# Patient Record
Sex: Female | Born: 1980 | Race: Asian | Hispanic: No | Marital: Married | Smoking: Never smoker
Health system: Southern US, Community
[De-identification: ages and names within clinical notes are randomized; demographics above are authoritative.]

## PROBLEM LIST (undated history)

## (undated) ENCOUNTER — Inpatient Hospital Stay (HOSPITAL_COMMUNITY): Payer: Self-pay

## (undated) DIAGNOSIS — R7611 Nonspecific reaction to tuberculin skin test without active tuberculosis: Secondary | ICD-10-CM

## (undated) DIAGNOSIS — R51 Headache: Secondary | ICD-10-CM

## (undated) HISTORY — PX: NO PAST SURGERIES: SHX2092

---

## 2010-09-15 ENCOUNTER — Ambulatory Visit
Admission: RE | Admit: 2010-09-15 | Discharge: 2010-09-15 | Disposition: A | Discharge status: Home / Self Care | Payer: No Typology Code available for payment source | Source: Ambulatory Visit | Attending: Infectious Diseases | Admitting: Infectious Diseases

## 2010-09-15 ENCOUNTER — Other Ambulatory Visit: Payer: Self-pay | Admitting: Infectious Diseases

## 2010-09-15 DIAGNOSIS — R7611 Nonspecific reaction to tuberculin skin test without active tuberculosis: Secondary | ICD-10-CM

## 2011-04-05 LAB — ABO/RH

## 2011-04-05 LAB — RUBELLA ANTIBODY, IGM: Rubella: IMMUNE

## 2011-04-30 ENCOUNTER — Other Ambulatory Visit (HOSPITAL_COMMUNITY): Payer: Self-pay | Admitting: Physician Assistant

## 2011-04-30 DIAGNOSIS — Z3689 Encounter for other specified antenatal screening: Secondary | ICD-10-CM

## 2011-05-03 ENCOUNTER — Ambulatory Visit (HOSPITAL_COMMUNITY)
Admission: RE | Admit: 2011-05-03 | Discharge: 2011-05-03 | Disposition: A | Discharge status: Home / Self Care | Payer: Medicaid Other | Source: Ambulatory Visit | Attending: Physician Assistant | Admitting: Physician Assistant

## 2011-05-03 DIAGNOSIS — Z363 Encounter for antenatal screening for malformations: Secondary | ICD-10-CM | POA: Insufficient documentation

## 2011-05-03 DIAGNOSIS — O358XX0 Maternal care for other (suspected) fetal abnormality and damage, not applicable or unspecified: Secondary | ICD-10-CM | POA: Insufficient documentation

## 2011-05-03 DIAGNOSIS — Z3689 Encounter for other specified antenatal screening: Secondary | ICD-10-CM

## 2011-05-03 DIAGNOSIS — Z1389 Encounter for screening for other disorder: Secondary | ICD-10-CM | POA: Insufficient documentation

## 2011-05-28 ENCOUNTER — Other Ambulatory Visit (HOSPITAL_COMMUNITY): Payer: Self-pay | Admitting: Family

## 2011-05-28 DIAGNOSIS — IMO0002 Reserved for concepts with insufficient information to code with codable children: Secondary | ICD-10-CM

## 2011-06-01 ENCOUNTER — Ambulatory Visit (HOSPITAL_COMMUNITY)
Admission: RE | Admit: 2011-06-01 | Discharge: 2011-06-01 | Disposition: A | Discharge status: Home / Self Care | Payer: Medicaid Other | Source: Ambulatory Visit | Attending: Family | Admitting: Family

## 2011-06-01 DIAGNOSIS — IMO0002 Reserved for concepts with insufficient information to code with codable children: Secondary | ICD-10-CM

## 2011-06-01 DIAGNOSIS — Z3689 Encounter for other specified antenatal screening: Secondary | ICD-10-CM | POA: Insufficient documentation

## 2011-07-05 LAB — RPR: RPR: NONREACTIVE

## 2011-07-05 LAB — HIV ANTIBODY (ROUTINE TESTING W REFLEX): HIV: NONREACTIVE

## 2011-07-05 LAB — GC/CHLAMYDIA PROBE AMP, GENITAL: Gonorrhea: NEGATIVE

## 2011-09-05 ENCOUNTER — Encounter (HOSPITAL_COMMUNITY): Payer: Self-pay | Admitting: Anesthesiology

## 2011-09-05 ENCOUNTER — Inpatient Hospital Stay (HOSPITAL_COMMUNITY): Payer: Medicaid Other | Admitting: Anesthesiology

## 2011-09-05 ENCOUNTER — Encounter (HOSPITAL_COMMUNITY): Payer: Self-pay

## 2011-09-05 ENCOUNTER — Inpatient Hospital Stay (HOSPITAL_COMMUNITY)
Admission: AD | Admit: 2011-09-05 | Discharge: 2011-09-08 | DRG: 775 | Disposition: A | Discharge status: Home Health | Payer: Medicaid Other | Source: Ambulatory Visit | Attending: Obstetrics & Gynecology | Admitting: Obstetrics & Gynecology

## 2011-09-05 DIAGNOSIS — O4290 Premature rupture of membranes, unspecified as to length of time between rupture and onset of labor, unspecified weeks of gestation: Secondary | ICD-10-CM | POA: Diagnosis present

## 2011-09-05 DIAGNOSIS — O42013 Preterm premature rupture of membranes, onset of labor within 24 hours of rupture, third trimester: Secondary | ICD-10-CM

## 2011-09-05 HISTORY — DX: Nonspecific reaction to tuberculin skin test without active tuberculosis: R76.11

## 2011-09-05 HISTORY — DX: Headache: R51

## 2011-09-05 LAB — CBC
HCT: 35.1 % — ABNORMAL LOW (ref 36.0–46.0)
Hemoglobin: 11.1 g/dL — ABNORMAL LOW (ref 12.0–15.0)
RDW: 13.9 % (ref 11.5–15.5)
WBC: 8.5 10*3/uL (ref 4.0–10.5)

## 2011-09-05 LAB — STREP B DNA PROBE

## 2011-09-05 MED ORDER — OXYTOCIN 20 UNITS IN LACTATED RINGERS INFUSION - SIMPLE
1.0000 m[IU]/min | INTRAVENOUS | Status: DC
  Administered 2011-09-05: 4 m[IU]/min via INTRAVENOUS
  Administered 2011-09-05: 6 m[IU]/min via INTRAVENOUS
  Administered 2011-09-05: 2 m[IU]/min via INTRAVENOUS
  Filled 2011-09-05: qty 1000

## 2011-09-05 MED ORDER — TERBUTALINE SULFATE 1 MG/ML IJ SOLN: 0.2500 mg | Freq: Once | INTRAMUSCULAR | Status: AC | PRN

## 2011-09-05 MED ORDER — DIPHENHYDRAMINE HCL 50 MG/ML IJ SOLN: 12.5000 mg | INTRAMUSCULAR | Status: DC | PRN

## 2011-09-05 MED ORDER — OXYTOCIN 20 UNITS IN LACTATED RINGERS INFUSION - SIMPLE
125.0000 mL/h | Freq: Once | INTRAVENOUS | Status: AC
  Administered 2011-09-06: 999 mL/h via INTRAVENOUS

## 2011-09-05 MED ORDER — ONDANSETRON HCL 4 MG/2ML IJ SOLN: 4.0000 mg | Freq: Four times a day (QID) | INTRAMUSCULAR | Status: DC | PRN

## 2011-09-05 MED ORDER — FENTANYL 2.5 MCG/ML BUPIVACAINE 1/10 % EPIDURAL INFUSION (WH - ANES)
14.0000 mL/h | INTRAMUSCULAR | Status: DC
  Administered 2011-09-06 (×3): 14 mL/h via EPIDURAL
  Filled 2011-09-05 (×4): qty 60

## 2011-09-05 MED ORDER — LACTATED RINGERS IV SOLN
INTRAVENOUS | Status: DC
  Administered 2011-09-05: 20:00:00 via INTRAVENOUS
  Administered 2011-09-06: 100 mL/h via INTRAVENOUS

## 2011-09-05 MED ORDER — LACTATED RINGERS IV SOLN: 500.0000 mL | INTRAVENOUS | Status: DC | PRN

## 2011-09-05 MED ORDER — PENICILLIN G POTASSIUM 5000000 UNITS IJ SOLR
5.0000 10*6.[IU] | Freq: Once | INTRAMUSCULAR | Status: AC
  Administered 2011-09-05: 5 10*6.[IU] via INTRAVENOUS
  Filled 2011-09-05: qty 5

## 2011-09-05 MED ORDER — PHENYLEPHRINE 40 MCG/ML (10ML) SYRINGE FOR IV PUSH (FOR BLOOD PRESSURE SUPPORT)
80.0000 ug | PREFILLED_SYRINGE | INTRAVENOUS | Status: DC | PRN
  Filled 2011-09-05: qty 5

## 2011-09-05 MED ORDER — OXYCODONE-ACETAMINOPHEN 5-325 MG PO TABS
1.0000 | ORAL_TABLET | ORAL | Status: DC | PRN
  Administered 2011-09-06: 1 via ORAL
  Filled 2011-09-05: qty 1

## 2011-09-05 MED ORDER — LACTATED RINGERS IV SOLN
500.0000 mL | Freq: Once | INTRAVENOUS | Status: AC
  Administered 2011-09-05: 500 mL via INTRAVENOUS

## 2011-09-05 MED ORDER — FENTANYL 2.5 MCG/ML BUPIVACAINE 1/10 % EPIDURAL INFUSION (WH - ANES)
INTRAMUSCULAR | Status: DC | PRN
  Administered 2011-09-05: 14 mL/h via EPIDURAL

## 2011-09-05 MED ORDER — ACETAMINOPHEN 325 MG PO TABS: 650.0000 mg | ORAL_TABLET | ORAL | Status: DC | PRN

## 2011-09-05 MED ORDER — LIDOCAINE HCL 1.5 % IJ SOLN
INTRAMUSCULAR | Status: DC | PRN
  Administered 2011-09-05 (×2): 5 mL via EPIDURAL

## 2011-09-05 MED ORDER — LIDOCAINE HCL (PF) 1 % IJ SOLN
30.0000 mL | INTRAMUSCULAR | Status: DC | PRN
  Administered 2011-09-06: 30 mL via SUBCUTANEOUS
  Filled 2011-09-05: qty 30

## 2011-09-05 MED ORDER — PHENYLEPHRINE 40 MCG/ML (10ML) SYRINGE FOR IV PUSH (FOR BLOOD PRESSURE SUPPORT): 80.0000 ug | PREFILLED_SYRINGE | INTRAVENOUS | Status: DC | PRN

## 2011-09-05 MED ORDER — PENICILLIN G POTASSIUM 5000000 UNITS IJ SOLR
2.5000 10*6.[IU] | INTRAVENOUS | Status: DC
  Administered 2011-09-05 – 2011-09-06 (×5): 2.5 10*6.[IU] via INTRAVENOUS
  Filled 2011-09-05 (×8): qty 2.5

## 2011-09-05 MED ORDER — EPHEDRINE 5 MG/ML INJ
10.0000 mg | INTRAVENOUS | Status: DC | PRN
  Filled 2011-09-05: qty 4

## 2011-09-05 MED ORDER — EPHEDRINE 5 MG/ML INJ: 10.0000 mg | INTRAVENOUS | Status: DC | PRN

## 2011-09-05 MED ORDER — FLEET ENEMA 7-19 GM/118ML RE ENEM: 1.0000 | ENEMA | RECTAL | Status: DC | PRN

## 2011-09-05 MED ORDER — OXYTOCIN BOLUS FROM INFUSION
500.0000 mL | Freq: Once | INTRAVENOUS | Status: DC
  Filled 2011-09-05: qty 1000
  Filled 2011-09-05: qty 500

## 2011-09-05 MED ORDER — IBUPROFEN 600 MG PO TABS
600.0000 mg | ORAL_TABLET | Freq: Four times a day (QID) | ORAL | Status: DC | PRN
  Administered 2011-09-06: 600 mg via ORAL
  Filled 2011-09-05: qty 1

## 2011-09-05 MED ORDER — CITRIC ACID-SODIUM CITRATE 334-500 MG/5ML PO SOLN: 30.0000 mL | ORAL | Status: DC | PRN

## 2011-09-05 NOTE — ED Provider Notes (Signed)
Attestation of Attending Supervision of Advanced Practitioner: Evaluation and management procedures were performed by the PA/NP/CNM/OB Fellow under my supervision/collaboration. Chart reviewed, and agree with management and plan.  Jaynie Collins, M.D. 09/05/2011 8:54 AM

## 2011-09-05 NOTE — Progress Notes (Signed)
Sumaya Marolyn Haller Win Valenti is a 31 y.o. G1P0 at [redacted]w[redacted]d by ultrasound admitted for rupture of membranes at 0700 today.  Subjective: Language line used for Burmese language interpreter.  Pt reports occasional painful contractions, but denies need for medication at this time.  Reviewed POC for PROM and pain management options including labor support/positioning, IV pain medication, and epidural with pt.  Opportunity provided for questions.  Husband present for teaching/questions.  Objective: BP 116/90  Pulse 107  Temp(Src) 98.5 F (36.9 C) (Oral)  Resp 16  Ht 5\' 2"  (1.575 m)  Wt 76.658 kg (169 lb)  BMI 30.91 kg/m2      FHT:  FHR: 135 bpm, variability: moderate,  accelerations:  Present,  decelerations:  Absent UC:   irregular, every 20-30 minutes, pt reports feeling occasional contraction  SVE:   Dilation: 1 Effacement (%): 50 Station: -2 Exam by:: ERice, PA  Labs: Prenatal labs:  ABO, Rh: O/Positive/-- (09/27 0000)  Antibody: Negative (09/27 0000)  Rubella: Immune (09/27 0000)  RPR: Nonreactive (12/27 0000)  HBsAg: Negative (12/27 0000)  HIV: Non-reactive (12/27 0000)  GBS: Unknown  1 hour GTT 78  Quad screen neg  Assessment / Plan: A: PROM   P: Labor: Expectant management for now, anticipate Pitocin in 2-3 hours if no spontaneous onset of labor. GBS: PCN G for GBS prophylaxis  Preeclampsia:  n/a Fetal Wellbeing:  Category I Pain Control:  Labor support without medications I/D:  n/a Anticipated MOD:  NSVD  LEFTWICH-KIRBY, Kelwin Gibler 09/05/2011, 9:35 AM

## 2011-09-05 NOTE — Progress Notes (Signed)
Yani Marolyn Haller Win Heckard is a 31 y.o. G1P0000 at [redacted]w[redacted]d  Subjective: Comfortable with "moderate" contraction, walking around. Doing well.  Objective: BP 122/70  Pulse 75  Temp(Src) 98.4 F (36.9 C) (Oral)  Resp 16  Ht 5\' 2"  (1.575 m)  Wt 76.658 kg (169 lb)  BMI 30.91 kg/m2     FHT:  FHR: 140 bpm, variability: moderate,  accelerations:  Present,  decelerations:  Absent UC:   Regular, 3 every 10 minutes SVE:   Dilation: 1 Effacement (%): 60 Station: -2 Exam by:: lee  Labs: Lab Results  Component Value Date   WBC 8.5 09/05/2011   HGB 11.1* 09/05/2011   HCT 35.1* 09/05/2011   MCV 81.8 09/05/2011   PLT 240 09/05/2011    Assessment / Plan: Augmentation of labor, progressing well  Labor: Progressing on Pitocin at 8 milliunits/min Fetal Wellbeing:  Category I Pain Control:  IV pain medication as needed Anticipated MOD:  NSVD  Lisbet Busker 09/05/2011, 4:13 PM

## 2011-09-05 NOTE — Progress Notes (Signed)
Jaylanni Marolyn Haller Win Savich is a 31 y.o. G1P0000 at [redacted]w[redacted]d   Subjective: Patient having increased pain. Would like epidural. Otherwise, doing well. Family at bedside.  Objective: BP 126/70  Pulse 75  Temp(Src) 98.6 F (37 C) (Oral)  Resp 18  Ht 5\' 2"  (1.575 m)  Wt 76.658 kg (169 lb)  BMI 30.91 kg/m2     FHT:  FHR: 140's bpm, variability: moderate,  accelerations:  Present,  decelerations:  Absent UC:   regular, 3 every 10 minutes SVE:   Dilation: 3 Effacement (%): 80 Station: -2 Exam by:: lee  Labs: Lab Results  Component Value Date   WBC 8.5 09/05/2011   HGB 11.1* 09/05/2011   HCT 35.1* 09/05/2011   MCV 81.8 09/05/2011   PLT 240 09/05/2011    Assessment / Plan: Augmentation of labor, progressing well  Labor: Progressing on Pitocin Fetal Wellbeing:  Category I Pain Control:  Would like epidural. (Will need interpreter) Anticipated MOD:  NSVD  Maryann Mccall 09/05/2011, 7:40 PM

## 2011-09-05 NOTE — Progress Notes (Signed)
Doing well, appears comfortable  FHR reassuring UCs irregular  Cervix deferred  Will continue plan of care

## 2011-09-05 NOTE — Progress Notes (Signed)
Pt states via Burmese interpreter that she had large gush of fluid at 0700, clear fluid. No recent exam of cervix. Denies pain at present. PNC at [facility].

## 2011-09-05 NOTE — H&P (Signed)
[name] is a 31 y.o. year old G1P0 female at [redacted]w[redacted]d weeks gestation by LMP verified by second trimester [place]. who presents to [facility]U reporting Spontaneous rupture of membranes at 0700 and mild UC's. [facility] She is a [place] refugee.  Maternal Medical History:  Reason for admission: Reason for admission: rupture of membranes.  Contractions: Frequency: regular.    Fetal activity: Perceived fetal activity is normal.    Prenatal Complications - Diabetes: none.    OB History    Grav Para Term Preterm Abortions TAB SAB Ect Mult Living   1              Past Medical History  Diagnosis Date  . Headache   . 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    Past Surgical History  Procedure Date  . No past surgeries    Family History: family history is negative for Anesthesia problems. Social History:  reports that she has never smoked. She has never used smokeless tobacco. She reports that she does not drink alcohol or use illicit drugs.  Review of Systems  Constitutional: Negative for fever and chills.  Eyes: Negative for blurred vision.  Neurological: Positive for headaches (mild).    Dilation: 1 Effacement (%): 50 Station: -2 Exam by:: ERice, PA Blood pressure 116/90, pulse 107, temperature 98.5 F (36.9 C), temperature source Oral, resp. rate 16. Maternal Exam:  Uterine Assessment: Contraction strength is mild.  Contraction frequency is regular.   Abdomen: Fundal height is S=D.   Fetal presentation: vertex  Introitus: Normal vulva. Normal vagina.  Ferning test: positive.  Amniotic fluid character: clear. Grossly ruptured.  Pelvis: adequate for delivery.   Cervix: Cervix evaluated by sterile speculum exam and digital exam.     Fetal Exam Fetal Monitor Review: Mode: ultrasound.   Baseline rate: 130.  Variability: moderate (6-25 bpm).   Pattern: accelerations present and no decelerations.    Fetal  State Assessment: Category I - tracings are normal.     Physical Exam  Nursing note and vitals reviewed. Constitutional: She is oriented to person, place, and time. She appears well-developed and well-nourished. She appears distressed.  Cardiovascular: Normal rate.   Respiratory: Effort normal.  GI: Soft. There is no tenderness.  Genitourinary: Vagina normal and uterus normal.  Neurological: She is alert and oriented to person, place, and time.  Skin: Skin is warm and dry.  Psychiatric: She has a normal mood and affect.    Prenatal labs: ABO, Rh: O/Positive/-- (09/27 0000) Antibody: Negative (09/27 0000) Rubella: Immune (09/27 0000) RPR: Nonreactive (12/27 0000)  HBsAg: Negative (12/27 0000)  HIV: Non-reactive (12/27 0000)  GBS:   Unknown 1 hour GTT 78 Quad screen neg  Assessment: 1. Labor: PPROM 2. Fetal Wellbeing: Category I  3. Pain Control: none 4. GBS: unk 5. 36 week IUP  Plan:  1. Admit to BS per consult with MD 2. Routine L&D orders 3. Analgesia/anesthesia PRN  4. PCN for unk GBC and preterm  Katrinka Blazing, Hansford Hirt 09/05/2011, 9:22 AM

## 2011-09-05 NOTE — H&P (Signed)
Attestation of Attending Supervision of Advanced Practitioner: Evaluation and management procedures were performed by the PA/NP/CNM/OB Fellow under my supervision/collaboration. Chart reviewed, and agree with management and plan.  Jaynie Collins, M.D. 09/05/2011 10:25 AM

## 2011-09-05 NOTE — Progress Notes (Signed)
[name] is a 31 y.o. G1P0 at [redacted]w[redacted]d   Subjective: Comfortable, feeling weak contractions  Objective: BP 120/66  Pulse 78  Temp(Src) 98.5 F (36.9 C) (Oral)  Resp 18  Ht 5\' 2"  (1.575 m)  Wt 76.658 kg (169 lb)  BMI 30.91 kg/m2    FHT:  FHR: 140's bpm, variability: moderate,  accelerations:  Present,  decelerations:  Absent UC:   regular, 3 every 10 minutes lasting 60-70 sec SVE:   Dilation: 1 Effacement (%): 60 Station: -2 Exam by:: lee  Labs: Lab Results  Component Value Date   WBC 8.5 09/05/2011   HGB 11.1* 09/05/2011   HCT 35.1* 09/05/2011   MCV 81.8 09/05/2011   PLT 240 09/05/2011    Assessment / Plan: Augmentation of labor, progressing well. Pitocin at 4 milliunits/min  Labor: Progressing on Pitocin Fetal Wellbeing:  Category I Pain Control:  Labor support without medications Anticipated MOD:  NSVD  [name] 09/05/2011, 12:47 PM

## 2011-09-05 NOTE — H&P (Signed)
Please refer to ED Provider Note for labor/PPROM admission details.  I agree with the findings, assessment and plan of the ED provider. Reassuring FHT, expect augmentation per protocol, GBS prophylaxis given preterm status.  Hopeful for vaginal delivery.  Jaynie Collins, M.D. 09/05/2011 8:57 AM

## 2011-09-05 NOTE — Anesthesia Preprocedure Evaluation (Signed)
Anesthesia Evaluation  Patient identified by MRN, date of birth, ID band Patient awake    Reviewed: Allergy & Precautions, H&P , NPO status , Patient's Chart, lab work & pertinent test results  Airway Mallampati: I TM Distance: >3 FB Neck ROM: full    Dental No notable dental hx.    Pulmonary neg pulmonary ROS,    Pulmonary exam normal       Cardiovascular neg cardio ROS     Neuro/Psych Negative Psych ROS   GI/Hepatic negative GI ROS, Neg liver ROS,   Endo/Other  Negative Endocrine ROS  Renal/GU negative Renal ROS  Genitourinary negative   Musculoskeletal negative musculoskeletal ROS (+)   Abdominal Normal abdominal exam  (+)   Peds negative pediatric ROS (+)  Hematology negative hematology ROS (+)   Anesthesia Other Findings   Reproductive/Obstetrics (+) Pregnancy                           Anesthesia Physical Anesthesia Plan  ASA: II  Anesthesia Plan: Epidural   Post-op Pain Management:    Induction:   Airway Management Planned:   Additional Equipment:   Intra-op Plan:   Post-operative Plan:   Informed Consent: I have reviewed the patients History and Physical, chart, labs and discussed the procedure including the risks, benefits and alternatives for the proposed anesthesia with the patient or authorized representative who has indicated his/her understanding and acceptance.     Plan Discussed with:   Anesthesia Plan Comments:         Anesthesia Quick Evaluation  

## 2011-09-05 NOTE — Anesthesia Procedure Notes (Signed)
Epidural Patient location during procedure: OB Start time: 09/05/2011 8:13 PM End time: 09/05/2011 8:17 PM Reason for block: procedure for pain  Staffing Anesthesiologist: Sandrea Hughs Performed by: anesthesiologist   Preanesthetic Checklist Completed: patient identified, site marked, surgical consent, pre-op evaluation, timeout performed, IV checked, risks and benefits discussed and monitors and equipment checked  Epidural Patient position: sitting Prep: site prepped and draped and DuraPrep Patient monitoring: continuous pulse ox and blood pressure Approach: midline Injection technique: LOR air  Needle:  Needle type: Tuohy  Needle gauge: 17 G Needle length: 9 cm Needle insertion depth: 6 cm Catheter type: closed end flexible Catheter size: 19 Gauge Catheter at skin depth: 11 cm Test dose: negative and 1.5% lidocaine  Assessment Sensory level: T8 Events: blood not aspirated, injection not painful, no injection resistance, negative IV test and no paresthesia

## 2011-09-05 NOTE — ED Provider Notes (Signed)
History   Pt presents today c/o leaking fluid that began around 7am. She also reports regular ctx. She reports GFM and deneis vag bleeding. All information obtained via interpreter.  Chief Complaint  Patient presents with  . Labor Eval   HPI  OB History    Grav Para Term Preterm Abortions TAB SAB Ect Mult Living   1               No past medical history on file.  No past surgical history on file.  No family history on file.  History  Substance Use Topics  . Smoking status: Not on file  . Smokeless tobacco: Not on file  . Alcohol Use: Not on file    Allergies: Allergies not on file  No prescriptions prior to admission    Review of Systems  Constitutional: Negative for fever and chills.  Eyes: Negative for blurred vision and double vision.  Respiratory: Negative for cough, hemoptysis, sputum production, shortness of breath and wheezing.   Cardiovascular: Negative for chest pain and palpitations.  Gastrointestinal: Positive for abdominal pain. Negative for nausea, vomiting, diarrhea and constipation.  Genitourinary: Negative for dysuria, urgency, frequency and hematuria.  Neurological: Negative for dizziness and headaches.  Psychiatric/Behavioral: Negative for depression and suicidal ideas.   Physical Exam   There were no vitals taken for this visit.  Physical Exam  Nursing note and vitals reviewed. Constitutional: She is oriented to person, place, and time. She appears well-developed and well-nourished. No distress.  HENT:  Head: Normocephalic and atraumatic.  Eyes: EOM are normal. Pupils are equal, round, and reactive to light.  GI: Soft. She exhibits no distension. There is no tenderness. There is no rebound and no guarding.  Genitourinary: No bleeding around the vagina. Vaginal discharge found.       Pt grossly rupture with clear fluid noted in vag vault. Fern test positive. Cervix 1/50/-2. Fetus is vertex.  Neurological: She is alert and oriented to person,  place, and time.  Skin: Skin is warm and dry. She is not diaphoretic.  Psychiatric: She has a normal mood and affect. Her behavior is normal. Judgment and thought content normal.    MAU Course  Procedures  Fern test positive.  Assessment and Plan  PPROM: pt in active labor. Will admit.  Clinton Gallant. Caia Lofaro III, DrHSc, MPAS, PA-C  09/05/2011, 8:19 AM   Henrietta Hoover, PA 09/05/11 [id number]

## 2011-09-06 ENCOUNTER — Encounter (HOSPITAL_COMMUNITY): Payer: Self-pay | Admitting: *Deleted

## 2011-09-06 DIAGNOSIS — O42919 Preterm premature rupture of membranes, unspecified as to length of time between rupture and onset of labor, unspecified trimester: Secondary | ICD-10-CM

## 2011-09-06 DIAGNOSIS — O4290 Premature rupture of membranes, unspecified as to length of time between rupture and onset of labor, unspecified weeks of gestation: Secondary | ICD-10-CM

## 2011-09-06 LAB — CBC
HCT: 29.2 % — ABNORMAL LOW (ref 36.0–46.0)
MCV: 81.1 fL (ref 78.0–100.0)
Platelets: 206 10*3/uL (ref 150–400)
RBC: 3.6 MIL/uL — ABNORMAL LOW (ref 3.87–5.11)
WBC: 15.7 10*3/uL — ABNORMAL HIGH (ref 4.0–10.5)

## 2011-09-06 MED ORDER — LANOLIN HYDROUS EX OINT: TOPICAL_OINTMENT | CUTANEOUS | Status: DC | PRN

## 2011-09-06 MED ORDER — ZOLPIDEM TARTRATE 5 MG PO TABS: 5.0000 mg | ORAL_TABLET | Freq: Every evening | ORAL | Status: DC | PRN

## 2011-09-06 MED ORDER — BENZOCAINE-MENTHOL 20-0.5 % EX AERO
INHALATION_SPRAY | CUTANEOUS | Status: AC
  Filled 2011-09-06: qty 56

## 2011-09-06 MED ORDER — IBUPROFEN 600 MG PO TABS
600.0000 mg | ORAL_TABLET | Freq: Four times a day (QID) | ORAL | Status: DC
  Administered 2011-09-06 – 2011-09-08 (×8): 600 mg via ORAL
  Filled 2011-09-06 (×9): qty 1

## 2011-09-06 MED ORDER — ONDANSETRON HCL 4 MG/2ML IJ SOLN: 4.0000 mg | INTRAMUSCULAR | Status: DC | PRN

## 2011-09-06 MED ORDER — BENZOCAINE-MENTHOL 20-0.5 % EX AERO
1.0000 "application " | INHALATION_SPRAY | CUTANEOUS | Status: DC | PRN
  Administered 2011-09-06: 1 via TOPICAL

## 2011-09-06 MED ORDER — SIMETHICONE 80 MG PO CHEW: 80.0000 mg | CHEWABLE_TABLET | ORAL | Status: DC | PRN

## 2011-09-06 MED ORDER — PRENATAL MULTIVITAMIN CH
1.0000 | ORAL_TABLET | Freq: Every day | ORAL | Status: DC
  Administered 2011-09-06 – 2011-09-08 (×3): 1 via ORAL
  Filled 2011-09-06 (×3): qty 1

## 2011-09-06 MED ORDER — DIPHENHYDRAMINE HCL 25 MG PO CAPS
25.0000 mg | ORAL_CAPSULE | Freq: Four times a day (QID) | ORAL | Status: DC | PRN
  Administered 2011-09-06: 25 mg via ORAL
  Filled 2011-09-06: qty 1

## 2011-09-06 MED ORDER — TETANUS-DIPHTH-ACELL PERTUSSIS 5-2.5-18.5 LF-MCG/0.5 IM SUSP
0.5000 mL | Freq: Once | INTRAMUSCULAR | Status: AC
  Administered 2011-09-07: 0.5 mL via INTRAMUSCULAR
  Filled 2011-09-06: qty 0.5

## 2011-09-06 MED ORDER — WITCH HAZEL-GLYCERIN EX PADS: 1.0000 "application " | MEDICATED_PAD | CUTANEOUS | Status: DC | PRN

## 2011-09-06 MED ORDER — ONDANSETRON HCL 4 MG PO TABS: 4.0000 mg | ORAL_TABLET | ORAL | Status: DC | PRN

## 2011-09-06 MED ORDER — SENNOSIDES-DOCUSATE SODIUM 8.6-50 MG PO TABS
2.0000 | ORAL_TABLET | Freq: Every day | ORAL | Status: DC
  Administered 2011-09-06 – 2011-09-07 (×2): 2 via ORAL

## 2011-09-06 MED ORDER — DIBUCAINE 1 % RE OINT: 1.0000 "application " | TOPICAL_OINTMENT | RECTAL | Status: DC | PRN

## 2011-09-06 MED ORDER — OXYCODONE-ACETAMINOPHEN 5-325 MG PO TABS
1.0000 | ORAL_TABLET | ORAL | Status: DC | PRN
  Administered 2011-09-06 – 2011-09-07 (×2): 1 via ORAL
  Filled 2011-09-06 (×2): qty 1

## 2011-09-06 NOTE — Progress Notes (Signed)
Patient ID: [name], female   DOB: 1981-05-30, 30 y.o.   MRN: [id number]  Developed sudden pain in Left leg.  No difference in edema. Trace edema bilaterally. Suspect it is nerve compression.  Cervix discovered to be 10/100/+2  FHR reactive UCs every 2-3 minutes  Will start pushing. If Left leg continues to be painful after delivery, consider dopplers.

## 2011-09-06 NOTE — Op Note (Signed)
Delivery Note At 6:50 AM a viable female was delivered via Vaginal, Spontaneous Delivery (Presentation: Left Occiput Anterior).  APGAR: 8, 8; weight 6 lb 9 oz (2977 g).   Placenta status: Intact, Spontaneous.  Cord: 3 vessels with the following complications: None.  Cord pH: Delivery by Wynelle Bourgeois, followed by identification of a shallow 4 th degree laceration, repaired by me with 4-0 vicryl on rectal mucosa, 3-0 vicryl closure of sphincter with figure of 8 sutures, then 3-0 vicryl closure of perineal body.  Digital rectal at end of repair confirms closure and absence of aberrant sutures.  Anesthesia: Epidural  Episiotomy: None Lacerations: 4th degree Suture Repair: 2.0 3.0 vicryl Est. Blood Loss (mL): 500  Mom to postpartum.  Baby to nursery-stable.some grunting, to be watched  [name] V 09/06/2011, 7:38 AM

## 2011-09-06 NOTE — Progress Notes (Signed)
Pt with c/o pain from foley.  [employer] interpreter called and reason for foley re-explained.  Pt and s/o still desired foley to be removed.  Foley d/c per request.  Remainder of labor process explained including to call for pressure, possible length of pushing, how to push, immediate recovery up to 2 hours and that nursery will come see infant.  Pt and s/o to request interpreter as needed for further discussion in l and d.

## 2011-09-06 NOTE — Progress Notes (Signed)
Patient ID: [name], female   DOB: December 16, 1980, 31 y.o.   MRN: [id number]  Now has epidural.  FHR reactive UCs frequent, every 1-2 minutes  Cervix per RN:  Dilation: 4 Effacement (%): 80 Cervical Position: Middle Station: -1 Presentation: Vertex Exam by:: a. harper, rnc-ob  Continue to observe for increased labor

## 2011-09-06 NOTE — Progress Notes (Signed)
UR Chart review completed.  

## 2011-09-06 NOTE — Progress Notes (Signed)
[employer] interpreter called and shift assessment completed.  Plan of care discussed. Pt then consented for epidural when Dr Arby Barrette into the room. Pt informed of process for epidural, continuous infusion, and labor process after epidural. Pt and s/o with no question or concerns at this time.

## 2011-09-06 NOTE — Progress Notes (Signed)
Patient ID: [name], female   DOB: [birth year], 31 y.o.   MRN: [id number]  Pushing for one hour  Able to seen small crowning but then head goes back in  FHR stable.  Will continue pushing.

## 2011-09-06 NOTE — Progress Notes (Signed)
Patient ID: [name], female   DOB: 1980-08-05, 31 y.o.   MRN: [id number] Sleeping. Two hours ago, had discomfort from contractions but thought it was from the foley and demanded the foley be removed Banker used phone interpretor).  Cervix at 0115 was 6-7cm.  Cervix now is Dilation: 9 Effacement (%): 90 Cervical Position: Middle Station: +1 Presentation: Vertex Exam by:: a. harper, rnc-ob  FHR reassuring UCs every 2 minutes  Will observe for second stage.  Push when patient has urge.

## 2011-09-07 NOTE — Anesthesia Postprocedure Evaluation (Signed)
Anesthesia Post Note  Patient: [name]  Procedure(s) Performed: * No procedures listed *  Anesthesia type: Epidural  Patient location: Mother/Baby  Post pain: Pain level controlled  Post assessment: Post-op Vital signs reviewed  Last Vitals:  Filed Vitals:   09/06/11 2100  BP: 106/70  Pulse: 80  Temp: 36.9 C  Resp: 18    Post vital signs: Reviewed  Level of consciousness: awake  Complications: No apparent anesthesia complications

## 2011-09-07 NOTE — Addendum Note (Signed)
Addendum  created 09/07/11 0827 by Madison Hickman, CRNA   Modules edited:Notes Section

## 2011-09-07 NOTE — Progress Notes (Signed)
Post Partum Day 1 Subjective: no complaints, up ad lib, voiding and tolerating PO  Objective: Blood pressure 109/75, pulse 86, temperature 98.3 F (36.8 C), temperature source Oral, resp. rate 18, height 5\' 2"  (1.575 m), weight 76.658 kg (169 lb), SpO2 97.00%, unknown if currently breastfeeding.  Physical Exam:  General: alert, cooperative and no distress Lochia: appropriate Uterine Fundus: firm DVT Evaluation: No evidence of DVT seen on physical exam.   Basename 09/06/11 1604 09/05/11 0935  HGB 9.4* 11.1*  HCT 29.2* 35.1*    Assessment/Plan: Plan for discharge tomorrow, Breastfeeding and Lactation consult   LOS: 2 days   [name] JEHIEL 09/07/2011, 2:23 PM

## 2011-09-07 NOTE — Anesthesia Postprocedure Evaluation (Signed)
  Anesthesia Post-op Note  Patient: [name]  Procedure(s) Performed: * No procedures listed *  Patient Location: Mother/Baby  Anesthesia Type: Epidural  Level of Consciousness: awake, alert  and oriented  Airway and Oxygen Therapy: Patient Spontanous Breathing  Post-op Pain: none  Post-op Assessment: Post-op Vital signs reviewed, Patient's Cardiovascular Status Stable, No headache, No backache, No residual numbness and No residual motor weakness  Post-op Vital Signs: Reviewed and stable  Complications: No apparent anesthesia complications

## 2011-09-08 MED ORDER — SENNOSIDES-DOCUSATE SODIUM 8.6-50 MG PO TABS: 2.0000 | ORAL_TABLET | Freq: Every day | ORAL | Status: AC

## 2011-09-08 MED ORDER — OXYCODONE-ACETAMINOPHEN 5-325 MG PO TABS: 1.0000 | ORAL_TABLET | ORAL | Status: AC | PRN

## 2011-09-08 MED ORDER — IBUPROFEN 600 MG PO TABS: 600.0000 mg | ORAL_TABLET | Freq: Four times a day (QID) | ORAL | Status: AC

## 2011-09-08 NOTE — Plan of Care (Signed)
Problem: Discharge Progression Outcomes Goal: Barriers To Progression Addressed/Resolved Outcome: Completed/Met Date Met:  09/08/11 Interpretor in to translate. Pt speaks burmese.

## 2011-09-08 NOTE — Discharge Instructions (Signed)
Vaginal Delivery °Care After °· Change your pad on each trip to the bathroom.  °· Wipe gently with toilet paper during your hospital stay. Always wipe from front to back. A spray bottle with warm tap water could also be used or a towelette if available.  °· Place your soiled pad and toilet paper in a bathroom wastebasket with a plastic bag liner.  °· During your hospital stay, save any clots. If you pass a clot while on the toilet, do not flush it. Also, if your vaginal flow seems excessive to you, notify nursing personnel.  °· The first time you get out of bed after delivery, wait for assistance from a nurse. Do not get up alone at any time if you feel weak or dizzy.  °· Bend and extend your ankles forcefully so that you feel the calves of your legs get hard. Do this 6 times every hour when you are in bed and awake.  °· Do not sit with one foot under you, dangle your legs over the edge of the bed, or maintain a position that hinders the circulation in your legs.  °· Many women experience after pains for 2 to 3 days after delivery. These after pains are mild uterine contractions. Ask the nurse for a pain medication if you need something for this. Sometimes breastfeeding stimulates after pains; if you find this to be true, ask for the medication ½ - ¾ hour before the next feeding.  °· For you and your infant's protection, do not go beyond the door(s) of the obstetric unit. Do not carry your baby in your arms in the hallway. When taking your baby to and from your room, put your baby in the bassinet and push the bassinet.  °· Mothers may have their babies in their room as much as they desire.  °Document Released: 06/22/2000 Document Revised: 03/07/2011 Document Reviewed: 05/23/2007 °ExitCare® Patient Information ©2012 [employer]. °

## 2011-09-08 NOTE — Discharge Summary (Signed)
Obstetric Discharge Summary Reason for Admission: rupture of membranes Prenatal Procedures: none Intrapartum Procedures: spontaneous vaginal delivery Postpartum Procedures: none Complications-Operative and Postpartum: fourth degree perineal laceration Hemoglobin  Date Value Range Status  09/06/2011 9.4* 12.0-15.0 (g/dL) Final     HCT  Date Value Range Status  09/06/2011 29.2* 36.0-46.0 (%) Final    Discharge Diagnoses: Term Pregnancy-delivered  Discharge Information: Date: 09/08/2011 Activity: pelvic rest Diet: routine Medications: PNV, Ibuprofen, Colace and Percocet Condition: stable Instructions: refer to practice specific booklet Discharge to: home Follow-up Information    Follow up with [facility] [place] in 6 weeks.   Contact information:   [address] [employer] [place] [address]          Newborn Data: Live born female  Birth Weight: 6 lb 9 oz (2977 g) APGAR: 8, 8  Home with mother.  [name] JEHIEL 09/08/2011, 11:09 AM

## 2012-10-10 IMAGING — US US OB FOLLOW-UP
1 series · 12 of 28 positions shown · non-contrast
Comparison: none

[Series 1: us ob follow up · 12 of 32 slices shown]
[im 2/32]
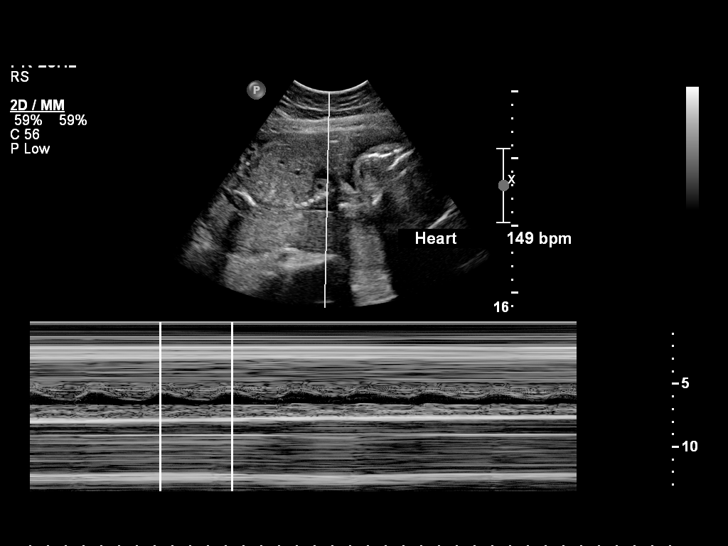
[im 4/32]
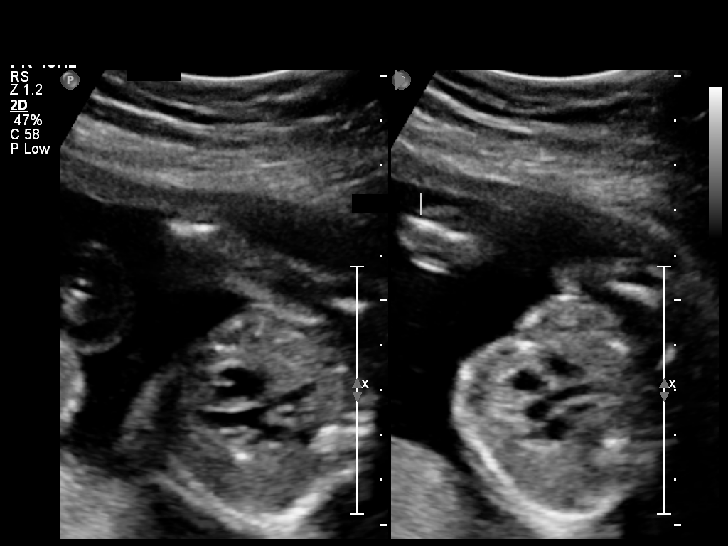
[im 6/32]
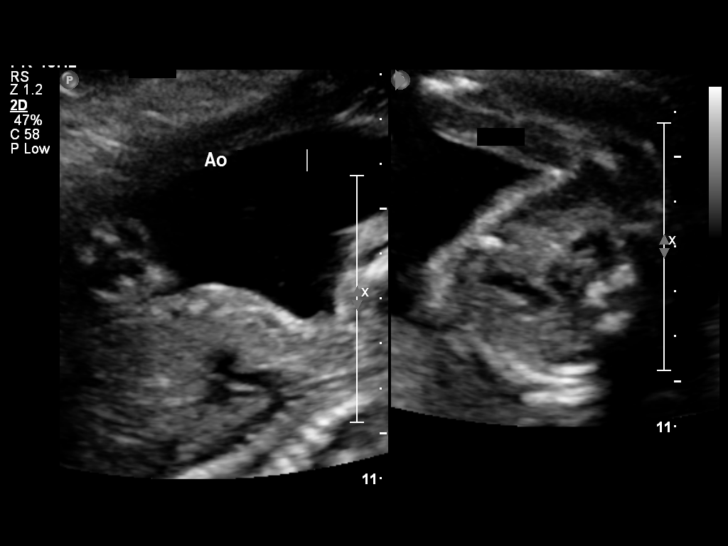
[im 10/32]
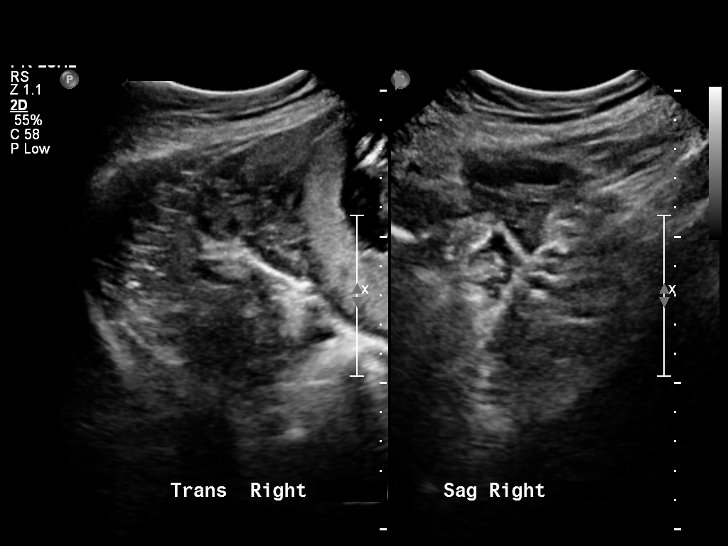
[im 12/32]
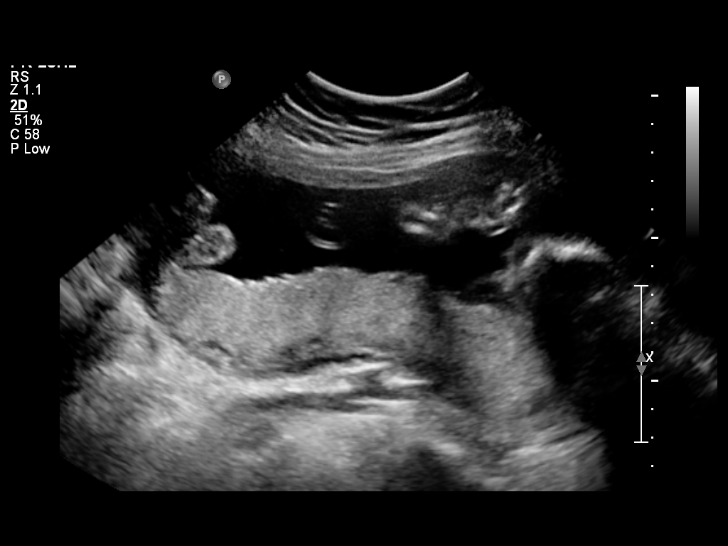
[im 14/32]
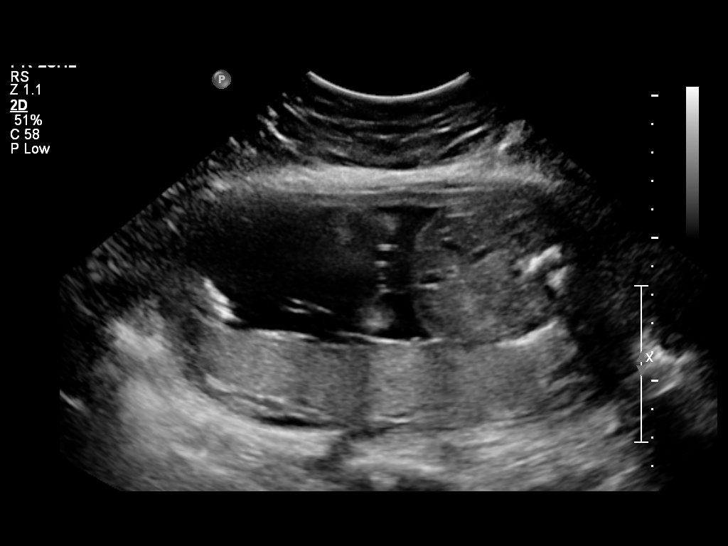
[im 18/32]
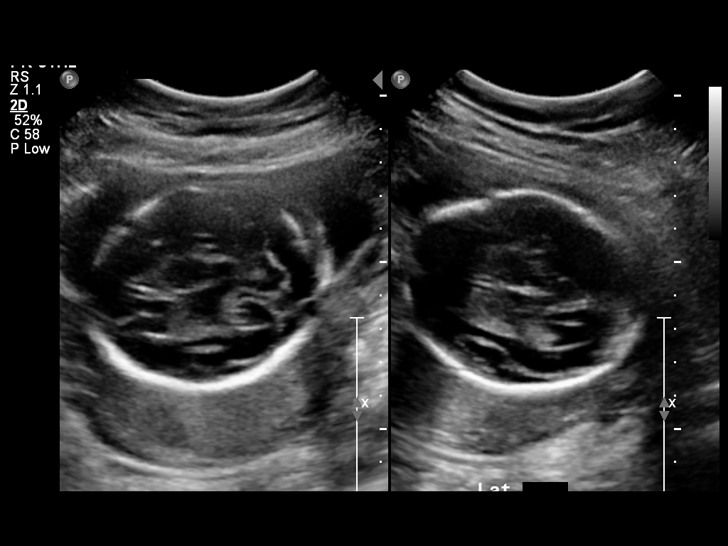
[im 20/32]
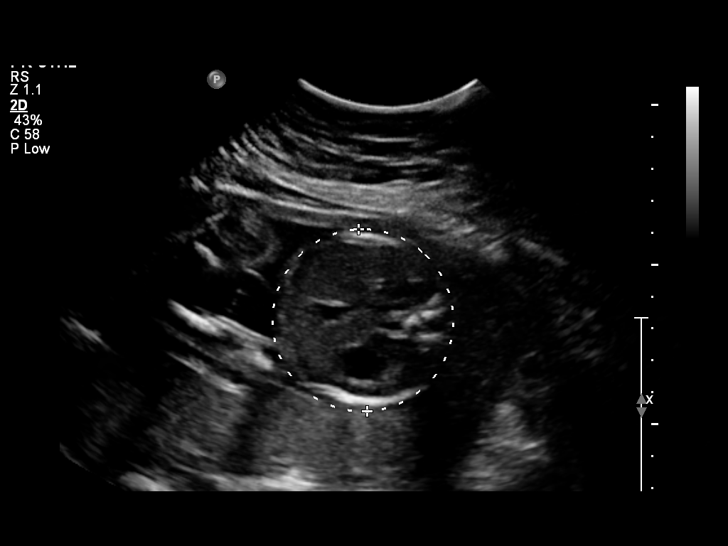
[im 22/32]
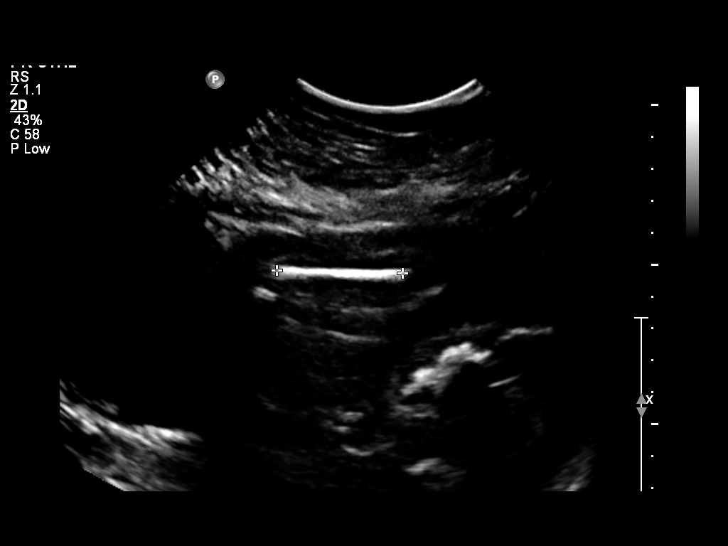
[im 26/32]
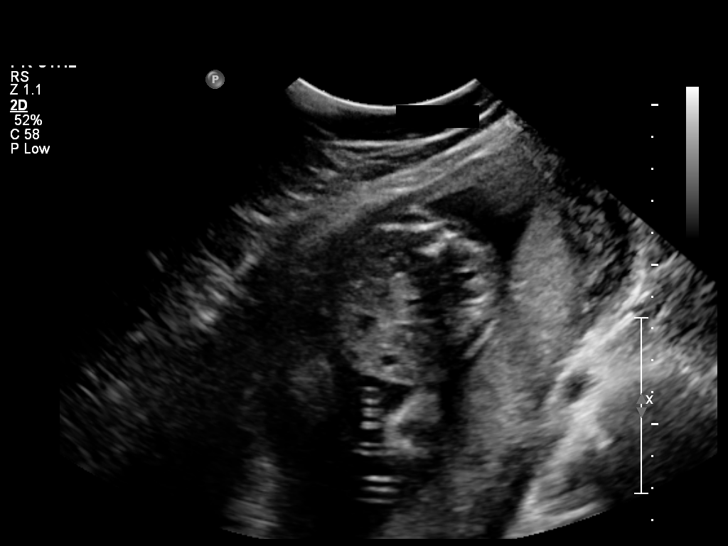
[im 28/32]
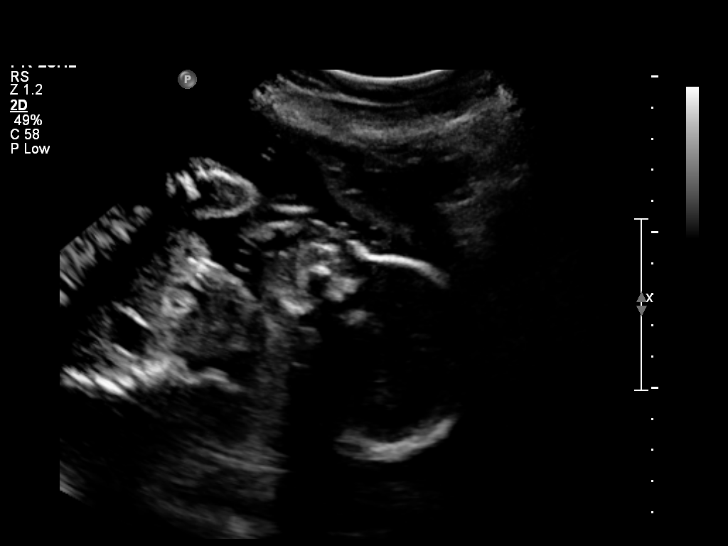
[im 30/32]
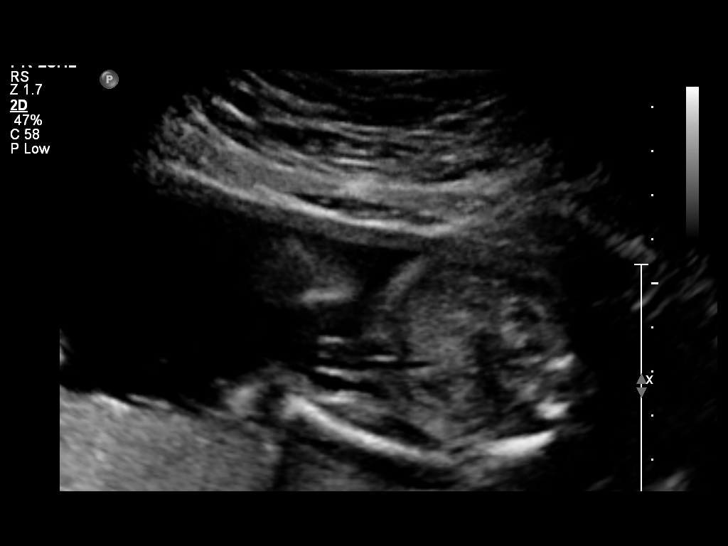

[12 of 28 positions shown; findings below may reference images not displayed]

OBSTETRICS REPORT
                      (Signed Final 06/01/2011 [DATE])

 Order#:         [id number]
Procedures

 US OB FOLLOW UP                                       76816.1
Indications

 Follow-up fetal anatomic evaluation
 Heart rhythm assess
Fetal Evaluation

 Fetal Heart Rate:  149                         bpm
 Cardiac Activity:  Observed
 Presentation:      Cephalic
 Placenta:          Posterior, above cervical
                    os
 P. Cord            Previously Visualized
 Insertion:

 Amniotic Fluid
 AFI FV:      Subjectively within normal limits
                                             Larg Pckt:     4.3  cm
Biometry

 BPD:     58.7  mm    G. Age:   24w 0d                CI:        77.68   70 - 86
                                                      FL/HC:      20.0   18.4 -

 HC:     210.8  mm    G. Age:   23w 1d       73  %    HC/AC:      1.18   1.06 -

 AC:       178  mm    G. Age:   22w 5d       55  %    FL/BPD:     71.7   71 - 87
 FL:      42.1  mm    G. Age:   23w 5d       83  %    FL/AC:      23.7   20 - 24
 HUM:     39.3  mm    G. Age:   24w 0d       89  %

 Est. FW:     570  gm      1 lb 4 oz     62  %
Gestational Age

 LMP:           22w 2d       Date:   12/27/10                 EDD:   10/03/11
 U/S Today:     23w 3d                                        EDD:   09/25/11
 Best:          22w 2d    Det. By:   LMP  (12/27/10)          EDD:   10/03/11
Anatomy
 Cranium:           Appears normal      Aortic Arch:       Appears normal
 Fetal Cavum:       Appears normal      Ductal Arch:       Not well
                                                           visualized
 Ventricles:        Appears normal      Diaphragm:         Appears normal
 Choroid Plexus:    Previously seen     Stomach:           Appears
                                                           normal, left
                                                           sided
 Cerebellum:        Appears normal      Abdomen:           Appears normal
 Posterior Fossa:   Appears normal      Abdominal Wall:    Appears nml
                                                           (cord insert,
                                                           abd wall)
 Nuchal Fold:       Previously seen     Cord Vessels:      Previously seen
 Face:              Previously seen     Kidneys:           Appear normal
 Heart:             Appears normal      Bladder:           Appears normal
                    (4 chamber &
                    axis)
 RVOT:              Appears normal      Spine:             Previously seen
 LVOT:              Appears normal      Limbs:             Previously seen

 Other:     Male gender.
Cervix Uterus Adnexa

 Cervical Length:   3.32      cm

 Cervix:       Closed.

 Adnexa:     No abnormality visualized.
Impression

 Single intrauterine gestation demonstrating an estimated
 gestational age by ultrasound of 23w 3d. This is correlated
 with expected estimated gestational age by LMP of 22w 2d.

 An improved anatomic evaluation of the fetal heart was
 possible with a normal appearance noted. Cardiac rate was
 regular today. Visualized fetal anatomy appears normal.

 Subjectively and quantitatively normal amniotic fluid volume.

 Normal cervical length and appearance.

 questions or concerns.

## 2014-05-10 ENCOUNTER — Encounter (HOSPITAL_COMMUNITY): Payer: Self-pay | Admitting: *Deleted

## 2014-07-09 NOTE — L&D Delivery Note (Signed)
Delivery Note [name] is a 34 y.o. female G2P0101 with an uncomplicated IUP at 7825w1d who presented on 05/19/2015. She was assessed to be in active labor and was subsequently admitted to the Labor & Delivery unit. An epidural was placed at the patient's request. She progressed as expected with spontaneous rupture of membranes and without issue or augmentation. At 2:43 AM she delivered a healthy baby girl via uncomplicated spontaneous vaginal delivery (LOA). Cord clamping was delayed and third stage of labor proceeded as expected with spontaneous delivery of the placenta and subsequent hemostasis.  Placenta status: intact Complications: none  Anesthesia: Epidural  Episiotomy: None Lacerations: 2nd degree perineal; 1st degree R labial Suture Repair: 3.0 vicryl Est. Blood Loss (mL): 250 cc   Mom to postpartum.  Baby to Couplet care / Skin to Skin.  Gerri Sporeaitlin Sullivan, MD 05/20/2015 3:16 AM   I was present and agree with the above

## 2014-10-28 ENCOUNTER — Encounter (HOSPITAL_COMMUNITY): Payer: Self-pay | Admitting: *Deleted

## 2014-10-28 ENCOUNTER — Inpatient Hospital Stay (HOSPITAL_COMMUNITY): Payer: Medicaid Other

## 2014-10-28 ENCOUNTER — Inpatient Hospital Stay (HOSPITAL_COMMUNITY)
Admission: AD | Admit: 2014-10-28 | Discharge: 2014-10-28 | Disposition: A | Discharge status: Home / Self Care | Payer: Medicaid Other | Source: Ambulatory Visit | Attending: Obstetrics & Gynecology | Admitting: Obstetrics & Gynecology

## 2014-10-28 DIAGNOSIS — O209 Hemorrhage in early pregnancy, unspecified: Secondary | ICD-10-CM | POA: Diagnosis not present

## 2014-10-28 DIAGNOSIS — Z3491 Encounter for supervision of normal pregnancy, unspecified, first trimester: Secondary | ICD-10-CM

## 2014-10-28 DIAGNOSIS — Z3A09 9 weeks gestation of pregnancy: Secondary | ICD-10-CM | POA: Diagnosis not present

## 2014-10-28 DIAGNOSIS — R109 Unspecified abdominal pain: Secondary | ICD-10-CM | POA: Diagnosis not present

## 2014-10-28 DIAGNOSIS — O26899 Other specified pregnancy related conditions, unspecified trimester: Secondary | ICD-10-CM

## 2014-10-28 LAB — CBC
HEMATOCRIT: 37.2 % (ref 36.0–46.0)
Hemoglobin: 12.1 g/dL (ref 12.0–15.0)
MCH: 26.5 pg (ref 26.0–34.0)
MCHC: 32.5 g/dL (ref 30.0–36.0)
MCV: 81.4 fL (ref 78.0–100.0)
PLATELETS: 256 10*3/uL (ref 150–400)
RBC: 4.57 MIL/uL (ref 3.87–5.11)
RDW: 14.4 % (ref 11.5–15.5)
WBC: 8.7 10*3/uL (ref 4.0–10.5)

## 2014-10-28 LAB — URINALYSIS, ROUTINE W REFLEX MICROSCOPIC
Bilirubin Urine: NEGATIVE
GLUCOSE, UA: NEGATIVE mg/dL
KETONES UR: NEGATIVE mg/dL
LEUKOCYTES UA: NEGATIVE
Nitrite: NEGATIVE
PROTEIN: NEGATIVE mg/dL
Specific Gravity, Urine: 1.025 (ref 1.005–1.030)
UROBILINOGEN UA: 0.2 mg/dL (ref 0.0–1.0)
pH: 6.5 (ref 5.0–8.0)

## 2014-10-28 LAB — WET PREP, GENITAL
CLUE CELLS WET PREP: NONE SEEN
TRICH WET PREP: NONE SEEN
YEAST WET PREP: NONE SEEN

## 2014-10-28 LAB — POCT PREGNANCY, URINE: PREG TEST UR: POSITIVE — AB

## 2014-10-28 LAB — URINE MICROSCOPIC-ADD ON

## 2014-10-28 LAB — HCG, QUANTITATIVE, PREGNANCY: hCG, Beta Chain, Quant, S: 179770 m[IU]/mL — ABNORMAL HIGH (ref <5)

## 2014-10-28 NOTE — MAU Provider Note (Signed)
Chief Complaint: Possible Pregnancy   First Provider Initiated Contact with Patient 10/28/14 1300     SUBJECTIVE HPI: [name] is a 34 y.o. G2P0101 at 388w0d by LMP who presents to Maternity Admissions reporting vaginal bleeding, 1/10 generalized non radiating abdominal burning, and dizziness onset of symptoms 1 month ago.  Vaginal bleeding occurred 1 month ago with 2-3 drops of dark red thick blood X1day bleeding stopped spontaneously.  Repeated vaginal bleeding 1 week ago with 3-4 drops of thick dark red blood X1day bleeding stopped spontaneously currently no signs of bleeding present.  Onset of dizziness 1 month ago. Dizziness is present at rest with most recent occurrence today.  Nothing makes the symptoms worse or better. Patient is not currently being following by a primary care provider, and is not currently receiving prenatal care.  She took a home pregnancy test 1 month ago with positive pregnancy results.    Past Medical History  Diagnosis Date  . Headache(784.0)   . 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    OB History  Gravida Para Term Preterm AB SAB TAB Ectopic Multiple Living  2 1 0 1 0 0 0 0 0 1     # Outcome Date GA Lbr Len/2nd Weight Sex Delivery Anes PTL Lv  2 Current           1 Preterm 09/06/11 4554w1d 21:35 / 02:15 2.977 kg (6 lb 9 oz) M Vag-Spont EPI  Y     Past Surgical History  Procedure Laterality Date  . No past surgeries     History   Social History  . Marital Status: Married    Spouse Name: N/A  . Number of Children: N/A  . Years of Education: N/A   Occupational History  . Not on file.   Social History Main Topics  . Smoking status: Never Smoker   . Smokeless tobacco: Never Used  . Alcohol Use: No  . Drug Use: No  . Sexual Activity: Yes    Birth Control/ Protection: None   Other Topics Concern  . Not on file   Social History Narrative   No current facility-administered medications on file prior to  encounter.   No current outpatient prescriptions on file prior to encounter.   No Known Allergies  Review of Systems  Constitutional: Positive for chills. Negative for fever.  Gastrointestinal: Positive for nausea and abdominal pain. Negative for vomiting, diarrhea, constipation and blood in stool.  Genitourinary: Positive for frequency. Negative for dysuria, urgency, hematuria and flank pain.    OBJECTIVE Blood pressure 97/58, pulse 83, temperature 98.4 F (36.9 C), temperature source Oral, resp. rate 18, last menstrual period 08/26/2014, unknown if currently breastfeeding. GENERAL: Well-developed, well-nourished female in no acute distress.  HEART: normal rate RESP: normal effort GI: Abdomen soft, non-tender. Positive bowel sounds 4. MS: Nontender, no edema NEURO: Alert and oriented SPECULUM EXAM: NEFG, physiologic discharge, no blood noted, cervix clean BIMANUAL: cervix  ; uterus normal size, no adnexal tenderness or masses  LAB RESULTS Results for orders placed or performed during the hospital encounter of 10/28/14 (from the past 24 hour(s))  Urinalysis, Routine w reflex microscopic     Status: Abnormal   Collection Time: 10/28/14 12:30 PM  Result Value Ref Range   Color, Urine YELLOW YELLOW   APPearance HAZY (A) CLEAR   Specific Gravity, Urine 1.025 1.005 - 1.030   pH 6.5 5.0 - 8.0   Glucose, UA  NEGATIVE NEGATIVE mg/dL   Hgb urine dipstick SMALL (A) NEGATIVE   Bilirubin Urine NEGATIVE NEGATIVE   Ketones, ur NEGATIVE NEGATIVE mg/dL   Protein, ur NEGATIVE NEGATIVE mg/dL   Urobilinogen, UA 0.2 0.0 - 1.0 mg/dL   Nitrite NEGATIVE NEGATIVE   Leukocytes, UA NEGATIVE NEGATIVE  Urine microscopic-add on     Status: Abnormal   Collection Time: 10/28/14 12:30 PM  Result Value Ref Range   Squamous Epithelial / LPF FEW (A) RARE   RBC / HPF 0-2 <3 RBC/hpf   Bacteria, UA FEW (A) RARE   Crystals CA OXALATE CRYSTALS (A) NEGATIVE   Urine-Other MUCOUS PRESENT   Pregnancy, urine POC      Status: Abnormal   Collection Time: 10/28/14 12:43 PM  Result Value Ref Range   Preg Test, Ur POSITIVE (A) NEGATIVE  hCG, quantitative, pregnancy     Status: Abnormal   Collection Time: 10/28/14  1:50 PM  Result Value Ref Range   hCG, Beta Chain, Quant, S 179770 (H) <5 mIU/mL  CBC     Status: None   Collection Time: 10/28/14  1:50 PM  Result Value Ref Range   WBC 8.7 4.0 - 10.5 K/uL   RBC 4.57 3.87 - 5.11 MIL/uL   Hemoglobin 12.1 12.0 - 15.0 g/dL   HCT 46.9 62.9 - 52.8 %   MCV 81.4 78.0 - 100.0 fL   MCH 26.5 26.0 - 34.0 pg   MCHC 32.5 30.0 - 36.0 g/dL   RDW 41.3 24.4 - 01.0 %   Platelets 256 150 - 400 K/uL  Wet prep, genital     Status: Abnormal   Collection Time: 10/28/14  3:20 PM  Result Value Ref Range   Yeast Wet Prep HPF POC NONE SEEN NONE SEEN   Trich, Wet Prep NONE SEEN NONE SEEN   Clue Cells Wet Prep HPF POC NONE SEEN NONE SEEN   WBC, Wet Prep HPF POC MODERATE (A) NONE SEEN    IMAGING US Ob Comp Less 14 Wks  10/28/2014   CLINICAL DATA:  Patient with vaginal bleeding 1 week ago. Abdominal pain.  EXAM: OBSTETRIC <14 WK [place] AND TRANSVAGINAL OB US  TECHNIQUE: Both transabdominal and transvaginal ultrasound examinations were performed for complete evaluation of the gestation as well as the maternal uterus, adnexal regions, and pelvic cul-de-sac. Transvaginal technique was performed to assess early pregnancy.  COMPARISON:  None.  FINDINGS: Intrauterine gestational sac: Visualized/normal in shape.  Yolk sac:  Present  Embryo:  Present  Cardiac Activity: Present  Heart Rate: 180  bpm  CRL:  23.3  mm   9 w   1 d                  [place] EDC: 06/01/2015  Maternal uterus/adnexae: Small subchorionic hemorrhage. Normal right and left ovaries. Corpus luteum within the left ovary. Trace free fluid in the pelvis.  IMPRESSION: Single live intrauterine gestation 9 weeks 0 days by LMP.   Electronically Signed   By: Annia Belt M.D.   On: 10/28/2014 14:58   US Ob Transvaginal  10/28/2014    CLINICAL DATA:  Patient with vaginal bleeding 1 week ago. Abdominal pain.  EXAM: OBSTETRIC <14 WK [place] AND TRANSVAGINAL OB US  TECHNIQUE: Both transabdominal and transvaginal ultrasound examinations were performed for complete evaluation of the gestation as well as the maternal uterus, adnexal regions, and pelvic cul-de-sac. Transvaginal technique was performed to assess early pregnancy.  COMPARISON:  None.  FINDINGS: Intrauterine gestational sac: Visualized/normal in  shape.  Yolk sac:  Present  Embryo:  Present  Cardiac Activity: Present  Heart Rate: 180  bpm  CRL:  23.3  mm   9 w   1 d                  [place] EDC: 06/01/2015  Maternal uterus/adnexae: Small subchorionic hemorrhage. Normal right and left ovaries. Corpus luteum within the left ovary. Trace free fluid in the pelvis.  IMPRESSION: Single live intrauterine gestation 9 weeks 0 days by LMP.   Electronically Signed   By: Annia Belt M.D.   On: 10/28/2014 14:58     ASSESSMENT 1. Normal IUP (intrauterine pregnancy) on prenatal ultrasound, first trimester   2. Vaginal bleeding in pregnancy, first trimester   3. Abdominal pain affecting pregnancy, antepartum     PLAN Language line with Burmese interpreter used for all communication. Reviewed normal U/S results with pt.  Pt [facility], desires care and assistance with Medicaid paperwork at [facility].   Message sent to [facility] to begin care  Discharge home in stable condition Return to MAU as needed for emergencies      Follow-up Information    Follow up with [facility].   Specialty:  Obstetrics and Gynecology   Why:  The clinic will call you with appointment.   Contact information:   [address] [place] [address] [phone]       Medication List    Notice    You have not been prescribed any medications.       LEFTWICH-KIRBY, Misty Stanley, CNM 3:49 PM

## 2014-10-28 NOTE — MAU Note (Signed)
First day of last menstrual period February 18th. Pt states she is having spotting a few times in the past couple months. Only a few drops of blood. After bleeding she has abdominal pain. Last time she spotted was a week ago. Took home pregnancy test around 1 month mark of pregnancy, but does not remember date. Patient came to MAU because she has not been able to get prenatal care at the health dept. Because she has a 33 year old that usually accompanies her. She does not yet have insurance. [facility]. Denies any other problems with pregnancy except a little dizziness. 2 Days ago she had a headache.

## 2014-10-28 NOTE — Discharge Instructions (Signed)
Vaginal Bleeding During Pregnancy, First Trimester  A small amount of bleeding (spotting) from the vagina is relatively common in early pregnancy. It usually stops on its own. Various things may cause bleeding or spotting in early pregnancy. Some bleeding may be related to the pregnancy, and some may not. In most cases, the bleeding is normal and is not a problem. However, bleeding can also be a sign of something serious. Be sure to tell your health care provider about any vaginal bleeding right away.  Some possible causes of vaginal bleeding during the first trimester include:  · Infection or inflammation of the cervix.  · Growths (polyps) on the cervix.  · Miscarriage or threatened miscarriage.  · Pregnancy tissue has developed outside of the uterus and in a fallopian tube (tubal pregnancy).  · Tiny cysts have developed in the uterus instead of pregnancy tissue (molar pregnancy).  HOME CARE INSTRUCTIONS   Watch your condition for any changes. The following actions may help to lessen any discomfort you are feeling:  · Follow your health care provider's instructions for limiting your activity. If your health care provider orders bed rest, you may need to stay in bed and only get up to use the bathroom. However, your health care provider may allow you to continue light activity.  · If needed, make plans for someone to help with your regular activities and responsibilities while you are on bed rest.  · Keep track of the number of pads you use each day, how often you change pads, and how soaked (saturated) they are. Write this down.  · Do not use tampons. Do not douche.  · Do not have sexual intercourse or orgasms until approved by your health care provider.  · If you pass any tissue from your vagina, save the tissue so you can show it to your health care provider.  · Only take over-the-counter or prescription medicines as directed by your health care provider.  · Do not take aspirin because it can make you  bleed.  · Keep all follow-up appointments as directed by your health care provider.  SEEK MEDICAL CARE IF:  · You have any vaginal bleeding during any part of your pregnancy.  · You have cramps or labor pains.  · You have a fever, not controlled by medicine.  SEEK IMMEDIATE MEDICAL CARE IF:   · You have severe cramps in your back or belly (abdomen).  · You pass large clots or tissue from your vagina.  · Your bleeding increases.  · You feel light-headed or weak, or you have fainting episodes.  · You have chills.  · You are leaking fluid or have a gush of fluid from your vagina.  · You pass out while having a bowel movement.  MAKE SURE YOU:  · Understand these instructions.  · Will watch your condition.  · Will get help right away if you are not doing well or get worse.  Document Released: 04/04/2005 Document Revised: 06/30/2013 Document Reviewed: 03/02/2013  ExitCare® Patient Information ©2015 [employer]. This information is not intended to replace advice given to you by your health care provider. Make sure you discuss any questions you have with your health care provider.

## 2014-10-28 NOTE — MAU Provider Note (Signed)
Chief Complaint: Possible Pregnancy   First Provider Initiated Contact with Patient 10/28/14 1300     SUBJECTIVE HPI: [name] is a 34 y.o. G2P0101 at 5731w0d by LMP who presents to Maternity Admissions reporting    Past Medical History  Diagnosis Date  . Headache(784.0)   . 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    OB History  Gravida Para Term Preterm AB SAB TAB Ectopic Multiple Living  2 1 0 1 0 0 0 0 0 1     # Outcome Date GA Lbr Len/2nd Weight Sex Delivery Anes PTL Lv  2 Current           1 Preterm 09/06/11 7119w1d 21:35 / 02:15 6 lb 9 oz (2.977 kg) M Vag-Spont EPI  Y     Past Surgical History  Procedure Laterality Date  . No past surgeries     History   Social History  . Marital Status: Married    Spouse Name: N/A  . Number of Children: N/A  . Years of Education: N/A   Occupational History  . Not on file.   Social History Main Topics  . Smoking status: Never Smoker   . Smokeless tobacco: Never Used  . Alcohol Use: No  . Drug Use: No  . Sexual Activity: Yes    Birth Control/ Protection: None   Other Topics Concern  . Not on file   Social History Narrative   No current facility-administered medications on file prior to encounter.   Current Outpatient Prescriptions on File Prior to Encounter  Medication Sig Dispense Refill  . acetaminophen (TYLENOL) 325 MG tablet Take 650 mg by mouth every 6 (six) hours as needed.    . Prenatal Vit-Fe Fumarate-FA (PRENATAL MULTIVITAMIN) TABS Take 1 tablet by mouth daily.     No Known Allergies  ROS  OBJECTIVE Blood pressure 120/72, pulse 83, temperature 98.4 F (36.9 C), temperature source Oral, resp. rate 18, last menstrual period 08/26/2014, unknown if currently breastfeeding. GENERAL: Well-developed, well-nourished female in no acute distress.  HEART: normal rate RESP: normal effort GI: Abdomen soft, non-tender. Positive bowel sounds 4. MS: Nontender, no edema NEURO: Alert  and oriented SPECULUM EXAM: NEFG, physiologic discharge, no blood noted, cervix clean BIMANUAL: cervix  ; uterus normal size, no adnexal tenderness or masses  LAB RESULTS Results for orders placed or performed during the hospital encounter of 10/28/14 (from the past 24 hour(s))  Pregnancy, urine POC     Status: Abnormal   Collection Time: 10/28/14 12:43 PM  Result Value Ref Range   Preg Test, Ur POSITIVE (A) NEGATIVE    IMAGING No results found.  MAU COURSE  ASSESSMENT No diagnosis found.  PLAN Discharge home in stable condition.    Medication List    ASK your doctor about these medications        acetaminophen 325 MG tablet  Commonly known as:  TYLENOL  Take 650 mg by mouth every 6 (six) hours as needed.     prenatal multivitamin Tabs tablet  Take 1 tablet by mouth daily.         [place]ia Thelonious Kauffmann, CNM 10/28/2014  1:01 PM

## 2014-10-29 LAB — GC/CHLAMYDIA PROBE AMP (~~LOC~~) NOT AT ARMC
Chlamydia: NEGATIVE
NEISSERIA GONORRHEA: NEGATIVE

## 2014-10-29 LAB — HIV ANTIBODY (ROUTINE TESTING W REFLEX): HIV Screen 4th Generation wRfx: NONREACTIVE

## 2014-11-11 ENCOUNTER — Ambulatory Visit (INDEPENDENT_AMBULATORY_CARE_PROVIDER_SITE_OTHER): Payer: Medicaid Other | Admitting: Family

## 2014-11-11 ENCOUNTER — Encounter: Payer: Self-pay | Admitting: Family

## 2014-11-11 VITALS — BP 116/71 | HR 72 | Temp 98.4°F | Wt 163.0 lb

## 2014-11-11 DIAGNOSIS — O0991 Supervision of high risk pregnancy, unspecified, first trimester: Secondary | ICD-10-CM

## 2014-11-11 DIAGNOSIS — O09891 Supervision of other high risk pregnancies, first trimester: Secondary | ICD-10-CM

## 2014-11-11 DIAGNOSIS — Z23 Encounter for immunization: Secondary | ICD-10-CM

## 2014-11-11 DIAGNOSIS — O09211 Supervision of pregnancy with history of pre-term labor, first trimester: Secondary | ICD-10-CM | POA: Diagnosis not present

## 2014-11-11 DIAGNOSIS — N898 Other specified noninflammatory disorders of vagina: Secondary | ICD-10-CM

## 2014-11-11 DIAGNOSIS — O0990 Supervision of high risk pregnancy, unspecified, unspecified trimester: Secondary | ICD-10-CM | POA: Insufficient documentation

## 2014-11-11 LAB — POCT URINALYSIS DIP (DEVICE)
Bilirubin Urine: NEGATIVE
Glucose, UA: NEGATIVE mg/dL
Hgb urine dipstick: NEGATIVE
KETONES UR: NEGATIVE mg/dL
Nitrite: NEGATIVE
PROTEIN: NEGATIVE mg/dL
SPECIFIC GRAVITY, URINE: 1.02 (ref 1.005–1.030)
Urobilinogen, UA: 0.2 mg/dL (ref 0.0–1.0)
pH: 6.5 (ref 5.0–8.0)

## 2014-11-11 MED ORDER — NITROFURANTOIN MONOHYD MACRO 100 MG PO CAPS: 100.0000 mg | ORAL_CAPSULE | Freq: Two times a day (BID) | ORAL | Status: DC

## 2014-11-11 MED ORDER — PRENATAL VITAMINS 0.8 MG PO TABS: 1.0000 | ORAL_TABLET | Freq: Every day | ORAL | Status: DC

## 2014-11-11 NOTE — Progress Notes (Signed)
Subjective:    [name] is a G2P0101 3959w0d being seen today for her first obstetrical visit.  Her obstetrical history is significant for history of preterm delivery after PPROM. Patient does intend to breast feed. Pregnancy history fully reviewed.  Patient reports no bleeding, no cramping, vaginal irritation and right sided pain (above uterus).  Also reports dysuria x 1-2 weeks.    Filed Vitals:   11/11/14 0856  BP: 116/71  Pulse: 72  Temp: 98.4 F (36.9 C)  Weight: 163 lb (73.936 kg)    HISTORY: OB History  Gravida Para Term Preterm AB SAB TAB Ectopic Multiple Living  2 1 0 1 0 0 0 0 0 1     # Outcome Date GA Lbr Len/2nd Weight Sex Delivery Anes PTL Lv  2 Current           1 Preterm 09/06/11 3533w1d 21:35 / 02:15 6 lb 9 oz (2.977 kg) M Vag-Spont EPI  Y     Complications: Preterm premature rupture of membranes (PPROM) delivered, current hospitalization     Past Medical History  Diagnosis Date  . Headache(784.0)   . 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    Past Surgical History  Procedure Laterality Date  . No past surgeries     Family History  Problem Relation Age of Onset  . Anesthesia problems Neg Hx      Exam   BP 116/71 mmHg  Pulse 72  Temp(Src) 98.4 F (36.9 C)  Wt 163 lb (73.936 kg)  LMP 08/26/2014 (Exact Date) Uterine Size: size equals dates  Pelvic Exam:    Perineum: No Hemorrhoids, Normal Perineum   Vulva: normal   Vagina:  normal mucosa, normal discharge, no palpable nodules   pH: Not done   Cervix: Spotting following Pap, no cervical motion tenderness and no lesions   Adnexa: normal adnexa and no mass, fullness, tenderness   Bony Pelvis: Adequate  System: Breast:  No nipple retraction or dimpling, No nipple discharge or bleeding, No axillary or supraclavicular adenopathy, Normal to palpation without dominant masses   Skin: normal coloration and turgor, no rashes    Neurologic: negative   Extremities:  normal strength, tone, and muscle mass   HEENT neck supple with midline trachea and thyroid without masses   Mouth/Teeth mucous membranes moist, pharynx normal without lesions   Neck supple and no masses   Cardiovascular: regular rate and rhythm, no murmurs or gallops   Respiratory:  appears well, vitals normal, no respiratory distress, acyanotic, normal RR, neck free of mass or lymphadenopathy, chest clear, no wheezing, crepitations, rhonchi, normal symmetric air entry   Abdomen: soft, non-tender; bowel sounds normal; no masses,  no organomegaly   Urinary: urethral meatus normal     Assessment:    Pregnancy:   34 y.o. G2P0101 at 4959w0d IUP Dysuria  Patient Active Problem List   Diagnosis Date Noted  . Supervision of high risk pregnancy, antepartum 11/11/2014  . History of preterm delivery, currently pregnant 11/11/2014  . Obstetric laceration, fourth degree 09/06/2011        Plan:     Initial labs drawn. Prenatal vitamins. Problem list reviewed and updated. Genetic Screening discussed First Screen: ordered. RX Macrobid; urine sent for cultue Plans to discuss 17p with husband.  Reviewed benefits of medication and potential sequale of having preterm infant.   Follow up in 2 weeks to determine if plan to use 17p  Marlis EdelsonKARIM, WALIDAH N 11/11/2014

## 2014-11-11 NOTE — Progress Notes (Signed)
Leukocytes: Large

## 2014-11-11 NOTE — Progress Notes (Signed)
Interpreter [name]  Early glucose due to BMI >30 Pain-right side  Weight gain 11-20lbs Flu vaccine consented and info given  New ob packet given

## 2014-11-11 NOTE — Progress Notes (Signed)
1st appointment with Nutrition.  Pt. Reports eating 2-3 meals/day.  States she does not receive food stamps.  [facility]   Overwt. Prior to pregnancy with wt loss of 2#. Discussed importance of eating 3 meals and 2-3 snacks daily for adequate wt gain.  Discussed wt gain goals of 15-25#. F/U prn Candice C. Earlene Plateravis, MPH, RD, LDN

## 2014-11-11 NOTE — Progress Notes (Signed)
First trimester screen 11/25/14 @ 1p with [facility].  Burmese interpreter [name] present.

## 2014-11-12 LAB — PRENATAL PROFILE (SOLSTAS)
Antibody Screen: NEGATIVE
BASOS PCT: 0 % (ref 0–1)
Basophils Absolute: 0 10*3/uL (ref 0.0–0.1)
Eosinophils Absolute: 0.1 10*3/uL (ref 0.0–0.7)
Eosinophils Relative: 1 % (ref 0–5)
HCT: 36.7 % (ref 36.0–46.0)
HEMOGLOBIN: 11.8 g/dL — AB (ref 12.0–15.0)
HEP B S AG: NEGATIVE
HIV 1&2 Ab, 4th Generation: NONREACTIVE
LYMPHS ABS: 1.2 10*3/uL (ref 0.7–4.0)
LYMPHS PCT: 12 % (ref 12–46)
MCH: 26.2 pg (ref 26.0–34.0)
MCHC: 32.2 g/dL (ref 30.0–36.0)
MCV: 81.4 fL (ref 78.0–100.0)
MONO ABS: 0.5 10*3/uL (ref 0.1–1.0)
MPV: 10 fL (ref 8.6–12.4)
Monocytes Relative: 5 % (ref 3–12)
NEUTROS ABS: 8.2 10*3/uL — AB (ref 1.7–7.7)
Neutrophils Relative %: 82 % — ABNORMAL HIGH (ref 43–77)
Platelets: 277 10*3/uL (ref 150–400)
RBC: 4.51 MIL/uL (ref 3.87–5.11)
RDW: 15.1 % (ref 11.5–15.5)
Rh Type: POSITIVE
Rubella: 2.12 Index — ABNORMAL HIGH (ref <0.90)
WBC: 10 10*3/uL (ref 4.0–10.5)

## 2014-11-12 LAB — PRESCRIPTION MONITORING PROFILE (19 PANEL)
Amphetamine/Meth: NEGATIVE ng/mL
BARBITURATE SCREEN, URINE: NEGATIVE ng/mL
Benzodiazepine Screen, Urine: NEGATIVE ng/mL
Buprenorphine, Urine: NEGATIVE ng/mL
CANNABINOID SCRN UR: NEGATIVE ng/mL
CARISOPRODOL, URINE: NEGATIVE ng/mL
COCAINE METABOLITES: NEGATIVE ng/mL
CREATININE, URINE: 42.96 mg/dL (ref >20.0)
FENTANYL URINE: NEGATIVE ng/mL
MDMA URINE: NEGATIVE ng/mL
METHADONE SCREEN, URINE: NEGATIVE ng/mL
Meperidine, Ur: NEGATIVE ng/mL
Methaqualone: NEGATIVE ng/mL
Nitrites, Initial: NEGATIVE ug/mL
OXYCODONE SCRN UR: NEGATIVE ng/mL
Opiate Screen, Urine: NEGATIVE ng/mL
Phencyclidine, Ur: NEGATIVE ng/mL
Propoxyphene: NEGATIVE ng/mL
TAPENTADOLUR: NEGATIVE ng/mL
Tramadol Scrn, Ur: NEGATIVE ng/mL
Zolpidem, Urine: NEGATIVE ng/mL
pH, Initial: 6.7 pH (ref 4.5–8.9)

## 2014-11-12 LAB — GLUCOSE TOLERANCE, 1 HOUR (50G) W/O FASTING: Glucose, 1 Hour GTT: 115 mg/dL (ref 70–140)

## 2014-11-12 LAB — CYTOLOGY - PAP

## 2014-11-12 LAB — WET PREP, GENITAL
Trich, Wet Prep: NONE SEEN
Yeast Wet Prep HPF POC: NONE SEEN

## 2014-11-14 LAB — CULTURE, OB URINE

## 2014-11-25 ENCOUNTER — Ambulatory Visit (INDEPENDENT_AMBULATORY_CARE_PROVIDER_SITE_OTHER): Payer: Self-pay | Admitting: Obstetrics & Gynecology

## 2014-11-25 ENCOUNTER — Encounter (HOSPITAL_COMMUNITY): Payer: Self-pay

## 2014-11-25 ENCOUNTER — Ambulatory Visit (HOSPITAL_COMMUNITY)
Admission: RE | Admit: 2014-11-25 | Discharge: 2014-11-25 | Disposition: A | Discharge status: Home / Self Care | Payer: Self-pay | Source: Ambulatory Visit | Attending: Family | Admitting: Family

## 2014-11-25 VITALS — BP 113/63 | HR 74 | Temp 98.3°F | Wt 160.6 lb

## 2014-11-25 DIAGNOSIS — O0991 Supervision of high risk pregnancy, unspecified, first trimester: Secondary | ICD-10-CM

## 2014-11-25 DIAGNOSIS — Z3A13 13 weeks gestation of pregnancy: Secondary | ICD-10-CM | POA: Insufficient documentation

## 2014-11-25 DIAGNOSIS — Z36 Encounter for antenatal screening of mother: Secondary | ICD-10-CM | POA: Insufficient documentation

## 2014-11-25 DIAGNOSIS — Z3682 Encounter for antenatal screening for nuchal translucency: Secondary | ICD-10-CM | POA: Insufficient documentation

## 2014-11-25 DIAGNOSIS — O0992 Supervision of high risk pregnancy, unspecified, second trimester: Secondary | ICD-10-CM

## 2014-11-25 LAB — POCT URINALYSIS DIP (DEVICE)
Bilirubin Urine: NEGATIVE
Glucose, UA: NEGATIVE mg/dL
KETONES UR: NEGATIVE mg/dL
Nitrite: POSITIVE — AB
PH: 7 (ref 5.0–8.0)
Protein, ur: NEGATIVE mg/dL
Specific Gravity, Urine: 1.025 (ref 1.005–1.030)
Urobilinogen, UA: 0.2 mg/dL (ref 0.0–1.0)

## 2014-11-25 MED ORDER — PROMETHAZINE HCL 25 MG PO TABS: 25.0000 mg | ORAL_TABLET | Freq: Four times a day (QID) | ORAL | Status: DC | PRN

## 2014-11-25 MED ORDER — HYDROXYPROGESTERONE CAPROATE 250 MG/ML IM OIL: 250.0000 mg | TOPICAL_OIL | Freq: Once | INTRAMUSCULAR | Status: DC

## 2014-11-25 NOTE — ED Notes (Signed)
Language interpreter [name] with pt.

## 2014-11-25 NOTE — Patient Instructions (Signed)
Hyperemesis Gravidarum °Hyperemesis gravidarum is a severe form of nausea and vomiting that happens during pregnancy. Hyperemesis is worse than morning sickness. It may cause you to have nausea or vomiting all day for many days. It may keep you from eating and drinking enough food and liquids. Hyperemesis usually occurs during the first half (the first 20 weeks) of pregnancy. It often goes away once a woman is in her second half of pregnancy. However, sometimes hyperemesis continues through an entire pregnancy.  °CAUSES  °The cause of this condition is not completely known but is thought to be related to changes in the body's hormones when pregnant. It could be from the high level of the pregnancy hormone or an increase in estrogen in the body.  °SIGNS AND SYMPTOMS  °· Severe nausea and vomiting. °· Nausea that does not go away. °· Vomiting that does not allow you to keep any food down. °· Weight loss and body fluid loss (dehydration). °· Having no desire to eat or not liking food you have previously enjoyed. °DIAGNOSIS  °Your health care provider will do a physical exam and ask you about your symptoms. He or she may also order blood tests and urine tests to make sure something else is not causing the problem.  °TREATMENT  °You may only need medicine to control the problem. If medicines do not control the nausea and vomiting, you will be treated in the hospital to prevent dehydration, increased acid in the blood (acidosis), weight loss, and changes in the electrolytes in your body that may harm the unborn baby (fetus). You may need IV fluids.  °HOME CARE INSTRUCTIONS  °· Only take over-the-counter or prescription medicines as directed by your health care provider. °· Try eating a couple of dry crackers or toast in the morning before getting out of bed. °· Avoid foods and smells that upset your stomach. °· Avoid fatty and spicy foods. °· Eat 5-6 small meals a day. °· Do not drink when eating meals. Drink between  meals. °· For snacks, eat high-protein foods, such as cheese. °· Eat or suck on things that have ginger in them. Ginger helps nausea. °· Avoid food preparation. The smell of food can spoil your appetite. °· Avoid iron pills and iron in your multivitamins until after 3-4 months of being pregnant. However, consult with your health care provider before stopping any prescribed iron pills. °SEEK MEDICAL CARE IF:  °· Your abdominal pain increases. °· You have a severe headache. °· You have vision problems. °· You are losing weight. °SEEK IMMEDIATE MEDICAL CARE IF:  °· You are unable to keep fluids down. °· You vomit blood. °· You have constant nausea and vomiting. °· You have excessive weakness. °· You have extreme thirst. °· You have dizziness or fainting. °· You have a fever or persistent symptoms for more than 2-3 days. °· You have a fever and your symptoms suddenly get worse. °MAKE SURE YOU:  °· Understand these instructions. °· Will watch your condition. °· Will get help right away if you are not doing well or get worse. °Document Released: 06/25/2005 Document Revised: 04/15/2013 Document Reviewed: 02/04/2013 °ExitCare® Patient Information ©2015 Exit[employer], LLC. This information is not intended to replace advice given to you by your health care provider. Make sure you discuss any questions you have with your health care provider. ° °

## 2014-11-25 NOTE — Progress Notes (Signed)
Interpreter Lum BabeLy Sha Mu Vaginal discharge- white with itchiness

## 2014-11-25 NOTE — Progress Notes (Signed)
Reviewed dating, Medicaid is pending, will want 17 P. Some nausea and emesis, weight loss noted. NT scheduled today. Phenergan rx sent

## 2014-12-01 ENCOUNTER — Other Ambulatory Visit (HOSPITAL_COMMUNITY): Payer: Self-pay | Admitting: Family

## 2014-12-16 ENCOUNTER — Ambulatory Visit (INDEPENDENT_AMBULATORY_CARE_PROVIDER_SITE_OTHER): Payer: Self-pay | Admitting: Obstetrics & Gynecology

## 2014-12-16 VITALS — BP 100/66 | HR 96 | Temp 98.5°F | Wt 160.5 lb

## 2014-12-16 DIAGNOSIS — O0992 Supervision of high risk pregnancy, unspecified, second trimester: Secondary | ICD-10-CM

## 2014-12-16 NOTE — Progress Notes (Signed)
Used interpreter Georga Bora Mu . States not taking prenatal vitamins or macrobid because has not gotten medicaid yet. Concerned about applying for 17 p due to bills. Explained there is a patient assistance program. Makena paperwork completed.

## 2014-12-16 NOTE — Patient Instructions (Signed)
Second Trimester of Pregnancy The second trimester is from week 13 through week 28, months 4 through 6. The second trimester is often a time when you feel your best. Your body has also adjusted to being pregnant, and you begin to feel better physically. Usually, morning sickness has lessened or quit completely, you may have more energy, and you may have an increase in appetite. The second trimester is also a time when the fetus is growing rapidly. At the end of the sixth month, the fetus is about 9 inches long and weighs about 1 pounds. You will likely begin to feel the baby move (quickening) between 18 and 20 weeks of the pregnancy. BODY CHANGES Your body goes through many changes during pregnancy. The changes vary from woman to woman.   Your weight will continue to increase. You will notice your lower abdomen bulging out.  You may begin to get stretch marks on your hips, abdomen, and breasts.  You may develop headaches that can be relieved by medicines approved by your health care provider.  You may urinate more often because the fetus is pressing on your bladder.  You may develop or continue to have heartburn as a result of your pregnancy.  You may develop constipation because certain hormones are causing the muscles that push waste through your intestines to slow down.  You may develop hemorrhoids or swollen, bulging veins (varicose veins).  You may have back pain because of the weight gain and pregnancy hormones relaxing your joints between the bones in your pelvis and as a result of a shift in weight and the muscles that support your balance.  Your breasts will continue to grow and be tender.  Your gums may bleed and may be sensitive to brushing and flossing.  Dark spots or blotches (chloasma, mask of pregnancy) may develop on your face. This will likely fade after the baby is born.  A dark line from your belly button to the pubic area (linea nigra) may appear. This will likely fade  after the baby is born.  You may have changes in your hair. These can include thickening of your hair, rapid growth, and changes in texture. Some women also have hair loss during or after pregnancy, or hair that feels dry or thin. Your hair will most likely return to normal after your baby is born. WHAT TO EXPECT AT YOUR PRENATAL VISITS During a routine prenatal visit:  You will be weighed to make sure you and the fetus are growing normally.  Your blood pressure will be taken.  Your abdomen will be measured to track your baby's growth.  The fetal heartbeat will be listened to.  Any test results from the previous visit will be discussed. Your health care provider may ask you:  How you are feeling.  If you are feeling the baby move.  If you have had any abnormal symptoms, such as leaking fluid, bleeding, severe headaches, or abdominal cramping.  If you have any questions. Other tests that may be performed during your second trimester include:  Blood tests that check for:  Low iron levels (anemia).  Gestational diabetes (between 24 and 28 weeks).  Rh antibodies.  Urine tests to check for infections, diabetes, or protein in the urine.  An ultrasound to confirm the proper growth and development of the baby.  An amniocentesis to check for possible genetic problems.  Fetal screens for spina bifida and Down syndrome. HOME CARE INSTRUCTIONS   Avoid all smoking, herbs, alcohol, and unprescribed   drugs. These chemicals affect the formation and growth of the baby.  Follow your health care provider's instructions regarding medicine use. There are medicines that are either safe or unsafe to take during pregnancy.  Exercise only as directed by your health care provider. Experiencing uterine cramps is a good sign to stop exercising.  Continue to eat regular, healthy meals.  Wear a good support bra for breast tenderness.  Do not use hot tubs, steam rooms, or saunas.  Wear your  seat belt at all times when driving.  Avoid raw meat, uncooked cheese, cat litter boxes, and soil used by cats. These carry germs that can cause birth defects in the baby.  Take your prenatal vitamins.  Try taking a stool softener (if your health care provider approves) if you develop constipation. Eat more high-fiber foods, such as fresh vegetables or fruit and whole grains. Drink plenty of fluids to keep your urine clear or pale yellow.  Take warm sitz baths to soothe any pain or discomfort caused by hemorrhoids. Use hemorrhoid cream if your health care provider approves.  If you develop varicose veins, wear support hose. Elevate your feet for 15 minutes, 3-4 times a day. Limit salt in your diet.  Avoid heavy lifting, wear low heel shoes, and practice good posture.  Rest with your legs elevated if you have leg cramps or low back pain.  Visit your dentist if you have not gone yet during your pregnancy. Use a soft toothbrush to brush your teeth and be gentle when you floss.  A sexual relationship may be continued unless your health care provider directs you otherwise.  Continue to go to all your prenatal visits as directed by your health care provider. SEEK MEDICAL CARE IF:   You have dizziness.  You have mild pelvic cramps, pelvic pressure, or nagging pain in the abdominal area.  You have persistent nausea, vomiting, or diarrhea.  You have a bad smelling vaginal discharge.  You have pain with urination. SEEK IMMEDIATE MEDICAL CARE IF:   You have a fever.  You are leaking fluid from your vagina.  You have spotting or bleeding from your vagina.  You have severe abdominal cramping or pain.  You have rapid weight gain or loss.  You have shortness of breath with chest pain.  You notice sudden or extreme swelling of your face, hands, ankles, feet, or legs.  You have not felt your baby move in over an hour.  You have severe headaches that do not go away with  medicine.  You have vision changes. Document Released: 06/19/2001 Document Revised: 06/30/2013 Document Reviewed: 08/26/2012 ExitCare Patient Information 2015 ExitCare, [employer]. This information is not intended to replace advice given to you by your health care provider. Make sure you discuss any questions you have with your health care provider.  

## 2014-12-16 NOTE — Progress Notes (Signed)
NT and PAPP a nl, will do AFP today  Subjective:cc: prenatal visit   [name] [name] is a 34 y.o. G2P0101 at [redacted]w[redacted]d being seen today for ongoing prenatal care.  Patient reports no complaints.  Contractions: Irregular.  Vag. Bleeding: None. Movement: Absent. Denies leaking of fluid.   The following portions of the patient's history were reviewed and updated as appropriate: allergies, current medications, past family history, past medical history, past social history, past surgical history and problem list.   Objective:   Filed Vitals:   12/16/14 1104  BP: 100/66  Pulse: 96  Temp: 98.5 F (36.9 C)  Weight: 160 lb 8 oz (72.802 kg)    Fetal Status: Fetal Heart Rate (bpm): 154   Movement: Absent     General:  Alert, oriented and cooperative. Patient is in no acute distress.  Skin: Skin is warm and dry. No rash noted.   Cardiovascular: Normal heart rate noted  Respiratory: Effort and breath sounds normal, no problems with respiration noted  Abdomen: Soft, gravid, appropriate for gestational age. Pain/Pressure: Present     Vaginal: Vag. Bleeding: None.       Cervix: Deferred  Extremities: Normal range of motion.  Edema: None  Mental Status: Normal mood and affect. Normal behavior. Normal judgment and thought content.   Urinalysis: Urine Protein: Negative Urine Glucose: Negative   Assessment and Plan:   Pregnancy: G2P0101 at [redacted]w[redacted]d  1. Supervision of high risk pregnancy, antepartum, second trimester Nl first screen - US OB Comp + 14 Wk; Future  AFP today Preterm labor symptoms and general obstetric precautions including but not limited to vaginal bleeding, contractions, leaking of fluid and fetal movement were reviewed in detail with the patient. Waiting for medicaid so she can get 17 p, check with SW today Please refer to After Visit Summary for other counseling recommendations.   Return in about 4 weeks (around 01/13/2015).   Adam Phenix, MD 11:25 AM

## 2014-12-16 NOTE — Progress Notes (Signed)
Anatomy ultrasound scheduled for 01/06/2015 @2 :00PM . Pt has Burmese interpreter [id number] Mu. (Frankey Poot, RN)

## 2014-12-17 LAB — POCT URINALYSIS DIP (DEVICE)
BILIRUBIN URINE: NEGATIVE
Glucose, UA: NEGATIVE mg/dL
Hgb urine dipstick: NEGATIVE
KETONES UR: NEGATIVE mg/dL
Leukocytes, UA: NEGATIVE
Nitrite: NEGATIVE
PH: 7 (ref 5.0–8.0)
PROTEIN: NEGATIVE mg/dL
Specific Gravity, Urine: 1.02 (ref 1.005–1.030)
Urobilinogen, UA: 0.2 mg/dL (ref 0.0–1.0)

## 2014-12-17 LAB — AFP, QUAD SCREEN
AFP: 29.8 ng/mL
Age Alone: 1:453 {titer}
Curr Gest Age: 16.6 wks.days
Down Syndrome Scr Risk Est: 1:2880 {titer}
HCG, Total: 32.72 IU/mL
INH: 200.2 pg/mL
INTERPRETATION-AFP: NEGATIVE
MOM FOR HCG: 0.99
MOM FOR INH: 1.2
MoM for AFP: 0.85
Open Spina bifida: NEGATIVE
Osb Risk: 1:22800 {titer}
TRI 18 SCR RISK EST: NEGATIVE
Trisomy 18 (Edward) Syndrome Interp.: 1:30300 {titer}
uE3 Mom: 1.13
uE3 Value: 1.13 ng/mL

## 2014-12-22 ENCOUNTER — Telehealth: Payer: Self-pay | Admitting: *Deleted

## 2014-12-22 NOTE — Telephone Encounter (Signed)
Received call from [facility] regarding Makena delivery. I returned the call and was informed that pt does qualify for the pt assistance program.  Deilvery of medication is scheduled for 6/17.

## 2015-01-06 ENCOUNTER — Ambulatory Visit (HOSPITAL_COMMUNITY)
Admission: RE | Admit: 2015-01-06 | Discharge: 2015-01-06 | Disposition: A | Discharge status: Home / Self Care | Payer: Medicaid Other | Source: Ambulatory Visit | Attending: Obstetrics & Gynecology | Admitting: Obstetrics & Gynecology

## 2015-01-06 ENCOUNTER — Encounter (HOSPITAL_COMMUNITY): Payer: Self-pay

## 2015-01-06 DIAGNOSIS — Z36 Encounter for antenatal screening of mother: Secondary | ICD-10-CM | POA: Insufficient documentation

## 2015-01-06 DIAGNOSIS — Z3A19 19 weeks gestation of pregnancy: Secondary | ICD-10-CM | POA: Insufficient documentation

## 2015-01-06 DIAGNOSIS — O09212 Supervision of pregnancy with history of pre-term labor, second trimester: Secondary | ICD-10-CM | POA: Diagnosis not present

## 2015-01-06 DIAGNOSIS — Z3689 Encounter for other specified antenatal screening: Secondary | ICD-10-CM | POA: Insufficient documentation

## 2015-01-06 DIAGNOSIS — O0992 Supervision of high risk pregnancy, unspecified, second trimester: Secondary | ICD-10-CM

## 2015-01-06 DIAGNOSIS — O09899 Supervision of other high risk pregnancies, unspecified trimester: Secondary | ICD-10-CM | POA: Insufficient documentation

## 2015-01-06 DIAGNOSIS — O09219 Supervision of pregnancy with history of pre-term labor, unspecified trimester: Secondary | ICD-10-CM

## 2015-01-13 ENCOUNTER — Ambulatory Visit (INDEPENDENT_AMBULATORY_CARE_PROVIDER_SITE_OTHER): Payer: Medicaid Other | Admitting: Family Medicine

## 2015-01-13 VITALS — BP 99/62 | HR 69 | Temp 98.7°F | Wt 161.4 lb

## 2015-01-13 DIAGNOSIS — O09212 Supervision of pregnancy with history of pre-term labor, second trimester: Secondary | ICD-10-CM | POA: Diagnosis not present

## 2015-01-13 DIAGNOSIS — O0992 Supervision of high risk pregnancy, unspecified, second trimester: Secondary | ICD-10-CM

## 2015-01-13 DIAGNOSIS — O09211 Supervision of pregnancy with history of pre-term labor, first trimester: Secondary | ICD-10-CM

## 2015-01-13 DIAGNOSIS — O09891 Supervision of other high risk pregnancies, first trimester: Secondary | ICD-10-CM

## 2015-01-13 LAB — POCT URINALYSIS DIP (DEVICE)
Bilirubin Urine: NEGATIVE
Glucose, UA: NEGATIVE mg/dL
HGB URINE DIPSTICK: NEGATIVE
Ketones, ur: NEGATIVE mg/dL
Leukocytes, UA: NEGATIVE
Nitrite: NEGATIVE
PROTEIN: NEGATIVE mg/dL
Specific Gravity, Urine: 1.02 (ref 1.005–1.030)
UROBILINOGEN UA: 0.2 mg/dL (ref 0.0–1.0)
pH: 6.5 (ref 5.0–8.0)

## 2015-01-13 NOTE — Progress Notes (Signed)
Used interpreter [employer]. States never took macrobid -she never picked up prescription- still waiting on medicaid. Urinalysis today negative.

## 2015-01-13 NOTE — Progress Notes (Signed)
Subjective:  Jerelyn Charlesang Yi Yi Win Musil is a 34 y.o. G2P0101 at 1537w0d being seen today for ongoing prenatal care.  Patient reports no complaints.  Contractions: Not present.  Vag. Bleeding: None. Movement: Absent. Denies leaking of fluid.   The following portions of the patient's history were reviewed and updated as appropriate: allergies, current medications, past family history, past medical history, past social history, past surgical history and problem list.   Objective:   Filed Vitals:   01/13/15 1132  BP: 99/62  Pulse: 69  Temp: 98.7 F (37.1 C)  Weight: 161 lb 6.4 oz (73.211 kg)    Fetal Status: Fetal Heart Rate (bpm): 152   Movement: Absent     General:  Alert, oriented and cooperative. Patient is in no acute distress.  Skin: Skin is warm and dry. No rash noted.   Cardiovascular: Normal heart rate noted  Respiratory: Normal respiratory effort, no problems with respiration noted  Abdomen: Soft, gravid, appropriate for gestational age. Pain/Pressure: Absent     Vaginal: Vag. Bleeding: None.    Vag D/C Character: White  Cervix: Not evaluated        Extremities: Normal range of motion.  Edema: None  Mental Status: Normal mood and affect. Normal behavior. Normal judgment and thought content.   Urinalysis: Urine Protein: Negative Urine Glucose: Negative  Assessment and Plan:  Pregnancy: G2P0101 at 6037w0d  1. Supervision of high risk pregnancy, antepartum, second trimester - US OB Follow Up; Future, to complete anatomy scan  2. History of preterm delivery, currently pregnant, first trimester - no Medicaid and as is at 20 weeks and has not yet applied for program to assist with paying for 17-P, is not a candidate   Preterm labor symptoms and general obstetric precautions including but not limited to vaginal bleeding, contractions, leaking of fluid and fetal movement were reviewed in detail with the patient.  Please refer to After Visit Summary for other counseling recommendations.    Return in about 4 weeks (around 02/10/2015).   Kathrynn RunningNoah Bedford Wouk, MD

## 2015-01-26 ENCOUNTER — Telehealth: Payer: Self-pay | Admitting: *Deleted

## 2015-01-26 NOTE — Telephone Encounter (Signed)
Received message from [facility] Luisa Hart(Patrick). He was inquiring to see if refill is needed of Makena medication. Pt currently has 4 single dose vials in medicine cabinet and apparently has not started receiving the injections. Per review of chart notes, it was thought by Dr. Ashok PallWouk on 7/7 that pt was not eligible [facility]. I called pt with [facility] # [phone].  Pt's husband answered and stated that Darleene Cleaverang is at home, he is at work now. There is no phone at home and he will not be at home until after 5pm.  He stated that he is aware of the medication which [facility] prevent premature [facility]. I stated that she has been approved for the free medication and we would like her to receive the first injection tomorrow. He stated that he will tell her and will bring her for the first injection tomorrow between 3:30-4:00 pm.

## 2015-01-27 ENCOUNTER — Ambulatory Visit (INDEPENDENT_AMBULATORY_CARE_PROVIDER_SITE_OTHER): Payer: Medicaid Other

## 2015-01-27 VITALS — BP 100/64 | HR 74 | Wt 162.7 lb

## 2015-01-27 DIAGNOSIS — O09212 Supervision of pregnancy with history of pre-term labor, second trimester: Secondary | ICD-10-CM | POA: Diagnosis not present

## 2015-01-27 DIAGNOSIS — O09892 Supervision of other high risk pregnancies, second trimester: Secondary | ICD-10-CM

## 2015-01-27 MED ORDER — HYDROXYPROGESTERONE CAPROATE 250 MG/ML IM OIL: 250.0000 mg | TOPICAL_OIL | Freq: Once | INTRAMUSCULAR | Status: DC

## 2015-01-27 MED ORDER — HYDROXYPROGESTERONE CAPROATE 250 MG/ML IM OIL
250.0000 mg | TOPICAL_OIL | INTRAMUSCULAR | Status: AC
  Administered 2015-01-27 – 2015-05-05 (×11): 250 mg via INTRAMUSCULAR

## 2015-01-27 NOTE — Progress Notes (Signed)
[employer] interpreter # [phone]

## 2015-01-31 ENCOUNTER — Telehealth: Payer: Self-pay | Admitting: *Deleted

## 2015-01-31 NOTE — Telephone Encounter (Signed)
Received a message left on nurse line on 01/31/15 at 1429 by Jamesetta So with [facility].  States she shows Cruzita Lederer should have been delivered on 01/06/15 and she doesn't show that the patient has received it yet.    I called back to the number listed.  Phyllis not available.  Person who answered took the message that the patient had her first dose of makena on 01/27/15.

## 2015-02-04 ENCOUNTER — Encounter (HOSPITAL_COMMUNITY): Payer: Self-pay

## 2015-02-04 ENCOUNTER — Ambulatory Visit (HOSPITAL_COMMUNITY)
Admission: RE | Admit: 2015-02-04 | Discharge: 2015-02-04 | Disposition: A | Discharge status: Home / Self Care | Payer: Medicaid Other | Source: Ambulatory Visit | Attending: Obstetrics and Gynecology | Admitting: Obstetrics and Gynecology

## 2015-02-04 ENCOUNTER — Ambulatory Visit: Payer: Self-pay

## 2015-02-04 ENCOUNTER — Ambulatory Visit (INDEPENDENT_AMBULATORY_CARE_PROVIDER_SITE_OTHER): Payer: Medicaid Other | Admitting: *Deleted

## 2015-02-04 VITALS — BP 97/58 | HR 77 | Temp 98.9°F | Resp 16 | Wt 164.4 lb

## 2015-02-04 VITALS — BP 112/58 | HR 79 | Wt 166.0 lb

## 2015-02-04 DIAGNOSIS — O09212 Supervision of pregnancy with history of pre-term labor, second trimester: Secondary | ICD-10-CM

## 2015-02-04 DIAGNOSIS — O09891 Supervision of other high risk pregnancies, first trimester: Secondary | ICD-10-CM

## 2015-02-04 DIAGNOSIS — Z3A23 23 weeks gestation of pregnancy: Secondary | ICD-10-CM | POA: Insufficient documentation

## 2015-02-04 DIAGNOSIS — O09211 Supervision of pregnancy with history of pre-term labor, first trimester: Secondary | ICD-10-CM | POA: Insufficient documentation

## 2015-02-04 DIAGNOSIS — O0992 Supervision of high risk pregnancy, unspecified, second trimester: Secondary | ICD-10-CM | POA: Diagnosis present

## 2015-02-04 DIAGNOSIS — O09892 Supervision of other high risk pregnancies, second trimester: Secondary | ICD-10-CM

## 2015-02-04 DIAGNOSIS — IMO0002 Reserved for concepts with insufficient information to code with codable children: Secondary | ICD-10-CM | POA: Insufficient documentation

## 2015-02-04 DIAGNOSIS — Z0489 Encounter for examination and observation for other specified reasons: Secondary | ICD-10-CM | POA: Insufficient documentation

## 2015-02-10 ENCOUNTER — Ambulatory Visit (INDEPENDENT_AMBULATORY_CARE_PROVIDER_SITE_OTHER): Payer: Medicaid Other | Admitting: Obstetrics & Gynecology

## 2015-02-10 ENCOUNTER — Telehealth: Payer: Self-pay | Admitting: *Deleted

## 2015-02-10 VITALS — BP 108/65 | HR 75 | Wt 164.2 lb

## 2015-02-10 DIAGNOSIS — Z789 Other specified health status: Secondary | ICD-10-CM

## 2015-02-10 DIAGNOSIS — O09212 Supervision of pregnancy with history of pre-term labor, second trimester: Secondary | ICD-10-CM

## 2015-02-10 DIAGNOSIS — O0992 Supervision of high risk pregnancy, unspecified, second trimester: Secondary | ICD-10-CM | POA: Diagnosis not present

## 2015-02-10 DIAGNOSIS — O09892 Supervision of other high risk pregnancies, second trimester: Secondary | ICD-10-CM

## 2015-02-10 LAB — POCT URINALYSIS DIP (DEVICE)
BILIRUBIN URINE: NEGATIVE
Glucose, UA: NEGATIVE mg/dL
HGB URINE DIPSTICK: NEGATIVE
Ketones, ur: NEGATIVE mg/dL
LEUKOCYTES UA: NEGATIVE
NITRITE: NEGATIVE
Protein, ur: 30 mg/dL — AB
Specific Gravity, Urine: 1.025 (ref 1.005–1.030)
Urobilinogen, UA: 0.2 mg/dL (ref 0.0–1.0)
pH: 7 (ref 5.0–8.0)

## 2015-02-10 NOTE — Progress Notes (Signed)
Subjective:  [name] is a 34 y.o. G2P0101 at [redacted]w[redacted]d being seen today for ongoing prenatal care.  Burmese interpreter present.  Patient reports no complaints.  Contractions: Not present.  Vag. Bleeding: None. Movement: Present. Denies leaking of fluid.   The following portions of the patient's history were reviewed and updated as appropriate: allergies, current medications, past family history, past medical history, past social history, past surgical history and problem list.   Objective:   Filed Vitals:   02/10/15 1049  BP: 108/65  Pulse: 75  Weight: 164 lb 3.2 oz (74.481 kg)    Fetal Status: Fetal Heart Rate (bpm): 152 Fundal Height: 24 cm Movement: Present     General:  Alert, oriented and cooperative. Patient is in no acute distress.  Skin: Skin is warm and dry. No rash noted.   Cardiovascular: Normal heart rate noted  Respiratory: Normal respiratory effort, no problems with respiration noted  Abdomen: Soft, gravid, appropriate for gestational age. Pain/Pressure: Absent     Vaginal: Vag. Bleeding: None.       Cervix: Not evaluated        Extremities: Normal range of motion.  Edema: None  Mental Status: Normal mood and affect. Normal behavior. Normal judgment and thought content.   Urinalysis: Urine Protein: 1+ Urine Glucose: Negative  Assessment and Plan:  Pregnancy: G2P0101 at [redacted]w[redacted]d  1. History of preterm delivery, currently pregnant, second trimester Cervical length 3 cm on 02/04/15 scan, continue to monitor. Continue weekly 17P.  2. Supervision of high risk pregnancy, antepartum, second trimester 3. Language barrier, speaks Burnese only Normal anatomy scan on 02/04/15, reviewed with patient Preterm labor symptoms and general obstetric precautions including but not limited to vaginal bleeding, contractions, leaking of fluid and fetal movement were reviewed in detail with the patient. Please refer to After Visit Summary for other counseling recommendations.  Return  in about 2 weeks (around 02/24/2015) for OB Visit.   Tereso Newcomer, MD

## 2015-02-10 NOTE — Telephone Encounter (Signed)
Received a message from [facility] re: if [name] is ready for her next refill of Makena.   Called [facility] back and notified them we only have one single refill and are ready for next refill. They will send out shipment this week.

## 2015-02-10 NOTE — Patient Instructions (Signed)
Return to clinic for any obstetric concerns or go to [facility] for evaluation  

## 2015-02-17 ENCOUNTER — Ambulatory Visit: Payer: Medicaid Other | Admitting: *Deleted

## 2015-02-17 DIAGNOSIS — O09212 Supervision of pregnancy with history of pre-term labor, second trimester: Secondary | ICD-10-CM

## 2015-02-17 NOTE — Progress Notes (Signed)
Pt receives her 17 p Makena injection on left side. Pt tolerated well.

## 2015-02-24 ENCOUNTER — Ambulatory Visit (INDEPENDENT_AMBULATORY_CARE_PROVIDER_SITE_OTHER): Payer: Medicaid Other | Admitting: Family Medicine

## 2015-02-24 VITALS — BP 98/60 | HR 87 | Temp 98.4°F | Wt 166.8 lb

## 2015-02-24 DIAGNOSIS — Z23 Encounter for immunization: Secondary | ICD-10-CM | POA: Diagnosis not present

## 2015-02-24 DIAGNOSIS — O0992 Supervision of high risk pregnancy, unspecified, second trimester: Secondary | ICD-10-CM | POA: Diagnosis not present

## 2015-02-24 DIAGNOSIS — O09892 Supervision of other high risk pregnancies, second trimester: Secondary | ICD-10-CM

## 2015-02-24 DIAGNOSIS — O09212 Supervision of pregnancy with history of pre-term labor, second trimester: Secondary | ICD-10-CM | POA: Diagnosis present

## 2015-02-24 LAB — CBC
HEMATOCRIT: 32.5 % — AB (ref 36.0–46.0)
Hemoglobin: 10.4 g/dL — ABNORMAL LOW (ref 12.0–15.0)
MCH: 26.6 pg (ref 26.0–34.0)
MCHC: 32 g/dL (ref 30.0–36.0)
MCV: 83.1 fL (ref 78.0–100.0)
MPV: 10.4 fL (ref 8.6–12.4)
Platelets: 253 10*3/uL (ref 150–400)
RBC: 3.91 MIL/uL (ref 3.87–5.11)
RDW: 14.9 % (ref 11.5–15.5)
WBC: 10 10*3/uL (ref 4.0–10.5)

## 2015-02-24 LAB — POCT URINALYSIS DIP (DEVICE)
Bilirubin Urine: NEGATIVE
Glucose, UA: NEGATIVE mg/dL
HGB URINE DIPSTICK: NEGATIVE
Ketones, ur: NEGATIVE mg/dL
Leukocytes, UA: NEGATIVE
NITRITE: NEGATIVE
PH: 6 (ref 5.0–8.0)
Protein, ur: NEGATIVE mg/dL
Specific Gravity, Urine: 1.01 (ref 1.005–1.030)
Urobilinogen, UA: 0.2 mg/dL (ref 0.0–1.0)

## 2015-02-24 MED ORDER — TETANUS-DIPHTH-ACELL PERTUSSIS 5-2.5-18.5 LF-MCG/0.5 IM SUSP
0.5000 mL | Freq: Once | INTRAMUSCULAR | Status: AC
  Administered 2015-02-24: 0.5 mL via INTRAMUSCULAR

## 2015-02-24 NOTE — Progress Notes (Signed)
Subjective:  [name] is a 34 y.o. G2P0101 at [redacted]w[redacted]d being seen today for ongoing prenatal care.  Patient reports no complaints.  Contractions: Not present.  Vag. Bleeding: None. Movement: Present. Denies leaking of fluid.   Receiving Makena for h/o preterm labor.  No complications with injections.  The following portions of the patient's history were reviewed and updated as appropriate: allergies, current medications, past family history, past medical history, past social history, past surgical history and problem list.   Objective:   Filed Vitals:   02/24/15 1037  BP: 98/60  Pulse: 87  Temp: 98.4 F (36.9 C)  Weight: 166 lb 12.8 oz (75.66 kg)    Fetal Status: Fetal Heart Rate (bpm): 144   Movement: Present     General:  Alert, oriented and cooperative. Patient is in no acute distress.  Skin: Skin is warm and dry. No rash noted.   Cardiovascular: Normal heart rate noted  Respiratory: Normal respiratory effort, no problems with respiration noted  Abdomen: Soft, gravid, appropriate for gestational age. Pain/Pressure: Absent     Pelvic: Vag. Bleeding: None     Cervical exam deferred        Extremities: Normal range of motion.  Edema: Trace  Mental Status: Normal mood and affect. Normal behavior. Normal judgment and thought content.   Urinalysis: Urine Protein: Negative Urine Glucose: Negative  Assessment and Plan:  Pregnancy: G2P0101 at [redacted]w[redacted]d  1. History of preterm delivery, currently pregnant, second trimester Continue makena - Tdap (BOOSTRIX) injection 0.5 mL; Inject 0.5 mLs into the muscle once. - CBC - RPR - HIV antibody (with reflex) - Glucose Tolerance, 1 HR (50g) w/o Fasting  2. Supervision of high risk pregnancy, antepartum, second trimester FHT and Fundal height normal.  Preterm labor symptoms and general obstetric precautions including but not limited to vaginal bleeding, contractions, leaking of fluid and fetal movement were reviewed in detail with the  patient. Please refer to After Visit Summary for other counseling recommendations.  No Follow-up on file.   Levie Heritage, DO

## 2015-02-24 NOTE — Progress Notes (Signed)
Georga Bora Mu used for interpreter Reviewed tip of week with patient

## 2015-02-25 LAB — RPR

## 2015-02-25 LAB — GLUCOSE TOLERANCE, 1 HOUR (50G) W/O FASTING: Glucose, 1 Hour GTT: 123 mg/dL (ref 70–140)

## 2015-02-25 LAB — HIV ANTIBODY (ROUTINE TESTING W REFLEX): HIV 1&2 Ab, 4th Generation: NONREACTIVE

## 2015-03-03 ENCOUNTER — Ambulatory Visit (INDEPENDENT_AMBULATORY_CARE_PROVIDER_SITE_OTHER): Payer: Medicaid Other

## 2015-03-03 VITALS — BP 104/63 | HR 84 | Wt 164.7 lb

## 2015-03-03 DIAGNOSIS — O09892 Supervision of other high risk pregnancies, second trimester: Secondary | ICD-10-CM

## 2015-03-03 DIAGNOSIS — O09212 Supervision of pregnancy with history of pre-term labor, second trimester: Secondary | ICD-10-CM

## 2015-03-10 ENCOUNTER — Telehealth: Payer: Self-pay | Admitting: *Deleted

## 2015-03-10 ENCOUNTER — Ambulatory Visit (INDEPENDENT_AMBULATORY_CARE_PROVIDER_SITE_OTHER): Payer: Medicaid Other

## 2015-03-10 VITALS — BP 107/70 | HR 68 | Wt 167.0 lb

## 2015-03-10 DIAGNOSIS — O09212 Supervision of pregnancy with history of pre-term labor, second trimester: Secondary | ICD-10-CM

## 2015-03-10 DIAGNOSIS — O09892 Supervision of other high risk pregnancies, second trimester: Secondary | ICD-10-CM

## 2015-03-10 NOTE — Progress Notes (Signed)
Requested refill on Makena and should receive on Tuesday per [place], pharmacy.

## 2015-03-10 NOTE — Telephone Encounter (Signed)
Received message left on nurse line 03/09/15 at 1827.  Luisa Hart with Makena.  Calling to see if patient is ready for refill.  Requests a return call.

## 2015-03-11 NOTE — Telephone Encounter (Signed)
Requested refill from [facility].  Makena should be at the Clinics on Tuesday 9/6.

## 2015-03-17 ENCOUNTER — Ambulatory Visit: Payer: Medicaid Other

## 2015-03-18 ENCOUNTER — Ambulatory Visit (INDEPENDENT_AMBULATORY_CARE_PROVIDER_SITE_OTHER): Payer: Medicaid Other | Admitting: *Deleted

## 2015-03-18 VITALS — BP 99/68 | HR 76 | Temp 98.6°F | Wt 166.6 lb

## 2015-03-18 DIAGNOSIS — O09213 Supervision of pregnancy with history of pre-term labor, third trimester: Secondary | ICD-10-CM

## 2015-03-18 DIAGNOSIS — O09893 Supervision of other high risk pregnancies, third trimester: Secondary | ICD-10-CM

## 2015-03-24 ENCOUNTER — Ambulatory Visit (INDEPENDENT_AMBULATORY_CARE_PROVIDER_SITE_OTHER): Payer: Medicaid Other | Admitting: Obstetrics & Gynecology

## 2015-03-24 VITALS — BP 103/70 | HR 79 | Temp 98.2°F | Wt 168.1 lb

## 2015-03-24 DIAGNOSIS — O0993 Supervision of high risk pregnancy, unspecified, third trimester: Secondary | ICD-10-CM | POA: Diagnosis not present

## 2015-03-24 DIAGNOSIS — Z23 Encounter for immunization: Secondary | ICD-10-CM

## 2015-03-24 LAB — POCT URINALYSIS DIP (DEVICE)
Bilirubin Urine: NEGATIVE
GLUCOSE, UA: NEGATIVE mg/dL
HGB URINE DIPSTICK: NEGATIVE
Ketones, ur: NEGATIVE mg/dL
Leukocytes, UA: NEGATIVE
Nitrite: NEGATIVE
PROTEIN: NEGATIVE mg/dL
SPECIFIC GRAVITY, URINE: 1.025 (ref 1.005–1.030)
Urobilinogen, UA: 0.2 mg/dL (ref 0.0–1.0)
pH: 7 (ref 5.0–8.0)

## 2015-03-24 NOTE — Progress Notes (Signed)
Subjective:  Karishma Unrein Win Bartko is a 34 y.o. G2P0101 at [redacted]w[redacted]d being seen today for ongoing prenatal care.  Patient reports no complaints.   .   .  . Denies leaking of fluid.   The following portions of the patient's history were reviewed and updated as appropriate: allergies, current medications, past family history, past medical history, past social history, past surgical history and problem list.   Objective:   Filed Vitals:   03/24/15 1131  BP: 103/70  Pulse: 79  Temp: 98.2 F (36.8 C)  Weight: 168 lb 1.6 oz (76.25 kg)    Fetal Status:   Fundal Height: 30 cm       General:  Alert, oriented and cooperative. Patient is in no acute distress.  Skin: Skin is warm and dry. No rash noted.   Cardiovascular: Normal heart rate noted  Respiratory: Normal respiratory effort, no problems with respiration noted  Abdomen: Soft, gravid, appropriate for gestational age.       Pelvic:       Cervical exam deferred        Extremities: Normal range of motion.  Edema: Trace  Mental Status: Normal mood and affect. Normal behavior. Normal judgment and thought content.   Urinalysis: Urine Protein: Negative Urine Glucose: Negative  Assessment and Plan:  Pregnancy: G2P0101 at [redacted]w[redacted]d  1. Supervision of high risk pregnancy, antepartum, third trimester H/o PTB, 17 P today  2. Needs flu shot Health maintenance - Flu Vaccine QUAD 36+ mos IM; Standing - Flu Vaccine QUAD 36+ mos IM  Preterm labor symptoms and general obstetric precautions including but not limited to vaginal bleeding, contractions, leaking of fluid and fetal movement were reviewed in detail with the patient. Please refer to After Visit Summary for other counseling recommendations.  Return in about 2 weeks (around 04/07/2015).   Adam Phenix, MD

## 2015-03-24 NOTE — Progress Notes (Signed)
[employer] Interpreter [phone] 17P and flu today

## 2015-03-24 NOTE — Patient Instructions (Signed)

## 2015-03-31 ENCOUNTER — Ambulatory Visit (INDEPENDENT_AMBULATORY_CARE_PROVIDER_SITE_OTHER): Payer: Medicaid Other

## 2015-03-31 VITALS — BP 105/66 | HR 78 | Wt 168.8 lb

## 2015-03-31 DIAGNOSIS — O09213 Supervision of pregnancy with history of pre-term labor, third trimester: Secondary | ICD-10-CM

## 2015-03-31 DIAGNOSIS — O09893 Supervision of other high risk pregnancies, third trimester: Secondary | ICD-10-CM

## 2015-04-05 ENCOUNTER — Telehealth: Payer: Self-pay | Admitting: *Deleted

## 2015-04-05 NOTE — Telephone Encounter (Signed)
Received call from [facility] left on nurse voicemail at 1202 today 04/05/15.  Requesting whether patient is ready for refill of medication.  Called back to pharmacy.  Medication to be shipped for delivery on Thursday 04/07/15.

## 2015-04-07 ENCOUNTER — Ambulatory Visit (INDEPENDENT_AMBULATORY_CARE_PROVIDER_SITE_OTHER): Payer: Medicaid Other | Admitting: *Deleted

## 2015-04-07 ENCOUNTER — Ambulatory Visit: Payer: Medicaid Other

## 2015-04-07 DIAGNOSIS — O09213 Supervision of pregnancy with history of pre-term labor, third trimester: Secondary | ICD-10-CM

## 2015-04-07 DIAGNOSIS — O09893 Supervision of other high risk pregnancies, third trimester: Secondary | ICD-10-CM

## 2015-04-14 ENCOUNTER — Encounter: Payer: Medicaid Other | Admitting: Family

## 2015-04-14 ENCOUNTER — Ambulatory Visit (INDEPENDENT_AMBULATORY_CARE_PROVIDER_SITE_OTHER): Payer: Medicaid Other | Admitting: Family

## 2015-04-14 VITALS — BP 109/66 | HR 83 | Temp 98.2°F | Wt 169.7 lb

## 2015-04-14 DIAGNOSIS — O0993 Supervision of high risk pregnancy, unspecified, third trimester: Secondary | ICD-10-CM

## 2015-04-14 DIAGNOSIS — O09213 Supervision of pregnancy with history of pre-term labor, third trimester: Secondary | ICD-10-CM | POA: Diagnosis not present

## 2015-04-14 DIAGNOSIS — O09893 Supervision of other high risk pregnancies, third trimester: Secondary | ICD-10-CM

## 2015-04-14 LAB — POCT URINALYSIS DIP (DEVICE)
BILIRUBIN URINE: NEGATIVE
GLUCOSE, UA: NEGATIVE mg/dL
Hgb urine dipstick: NEGATIVE
Ketones, ur: NEGATIVE mg/dL
NITRITE: NEGATIVE
Protein, ur: NEGATIVE mg/dL
Specific Gravity, Urine: 1.02 (ref 1.005–1.030)
Urobilinogen, UA: 0.2 mg/dL (ref 0.0–1.0)
pH: 7 (ref 5.0–8.0)

## 2015-04-14 NOTE — Progress Notes (Signed)
Subjective:  [name] is a 34 y.o. G2P0101 at [redacted]w[redacted]d being seen today for ongoing prenatal care.  Patient reports no complaints.  Contractions: Not present.  Vag. Bleeding: None. Movement: Present. Denies leaking of fluid.   The following portions of the patient's history were reviewed and updated as appropriate: allergies, current medications, past family history, past medical history, past social history, past surgical history and problem list.   Objective:   Filed Vitals:   04/14/15 1131  BP: 109/66  Pulse: 83  Temp: 98.2 F (36.8 C)  Weight: 169 lb 11.2 oz (76.975 kg)    Fetal Status: Fetal Heart Rate (bpm): 154   Movement: Present     General:  Alert, oriented and cooperative. Patient is in no acute distress.  Skin: Skin is warm and dry. No rash noted.   Cardiovascular: Normal heart rate noted  Respiratory: Normal respiratory effort, no problems with respiration noted  Abdomen: Soft, gravid, appropriate for gestational age. Pain/Pressure: Present     Pelvic: Vag. Bleeding: None Vag D/C Character: White   Cervical exam deferred        Extremities: Normal range of motion.  Edema: Trace  Mental Status: Normal mood and affect. Normal behavior. Normal judgment and thought content.   Urinalysis: Urine Protein: Negative Urine Glucose: Negative  Assessment and Plan:  Pregnancy: G2P0101 at [redacted]w[redacted]d  1. History of preterm delivery, currently pregnant, third trimester -17p  Preterm labor symptoms and general obstetric precautions including but not limited to vaginal bleeding, contractions, leaking of fluid and fetal movement were reviewed in detail with the patient. Please refer to After Visit Summary for other counseling recommendations.  Return in about 1 week (around 04/21/2015) for 17p and 2 weeks 17p and appt.   Eino Farber Kennith Gain, CNM

## 2015-04-14 NOTE — Progress Notes (Signed)
Interpreter present for encounter Breastfeeding tip of the week reviewed  Leukocytes trace 17P injection

## 2015-04-22 ENCOUNTER — Ambulatory Visit (INDEPENDENT_AMBULATORY_CARE_PROVIDER_SITE_OTHER): Payer: Medicaid Other | Admitting: *Deleted

## 2015-04-22 DIAGNOSIS — O09213 Supervision of pregnancy with history of pre-term labor, third trimester: Secondary | ICD-10-CM

## 2015-04-22 DIAGNOSIS — O09893 Supervision of other high risk pregnancies, third trimester: Secondary | ICD-10-CM

## 2015-04-28 ENCOUNTER — Ambulatory Visit (INDEPENDENT_AMBULATORY_CARE_PROVIDER_SITE_OTHER): Payer: Medicaid Other | Admitting: Family

## 2015-04-28 VITALS — BP 102/62 | HR 78 | Temp 98.2°F | Wt 172.7 lb

## 2015-04-28 DIAGNOSIS — O09213 Supervision of pregnancy with history of pre-term labor, third trimester: Secondary | ICD-10-CM | POA: Diagnosis present

## 2015-04-28 DIAGNOSIS — O09893 Supervision of other high risk pregnancies, third trimester: Secondary | ICD-10-CM

## 2015-04-28 LAB — POCT URINALYSIS DIP (DEVICE)
Bilirubin Urine: NEGATIVE
GLUCOSE, UA: NEGATIVE mg/dL
Hgb urine dipstick: NEGATIVE
KETONES UR: NEGATIVE mg/dL
Nitrite: NEGATIVE
Protein, ur: NEGATIVE mg/dL
Specific Gravity, Urine: 1.02 (ref 1.005–1.030)
UROBILINOGEN UA: 0.2 mg/dL (ref 0.0–1.0)
pH: 7 (ref 5.0–8.0)

## 2015-04-28 NOTE — Progress Notes (Signed)
Language Resources Moo Zar Interpreter  Edema- feet   Pressure- stretching

## 2015-04-28 NOTE — Progress Notes (Signed)
Subjective:  [name] is a 34 y.o. G2P0101 at 4748w0d being seen today for ongoing prenatal care.  Patient reports no complaints.  Contractions: Not present.  Vag. Bleeding: None. Movement: Present. Denies leaking of fluid.   The following portions of the patient's history were reviewed and updated as appropriate: allergies, current medications, past family history, past medical history, past social history, past surgical history and problem list. Problem list updated.  Objective:   Filed Vitals:   04/28/15 1115  BP: 102/62  Pulse: 78  Temp: 98.2 F (36.8 C)  Weight: 172 lb 11.2 oz (78.336 kg)    Fetal Status: Fetal Heart Rate (bpm): 140 Fundal Height: 35 cm Movement: Present     General:  Alert, oriented and cooperative. Patient is in no acute distress.  Skin: Skin is warm and dry. No rash noted.   Cardiovascular: Normal heart rate noted  Respiratory: Normal respiratory effort, no problems with respiration noted  Abdomen: Soft, gravid, appropriate for gestational age. Pain/Pressure: Present     Pelvic: Vag. Bleeding: None Vag D/C Character: White   Cervical exam deferred        Extremities: Normal range of motion.  Edema: None  Mental Status: Normal mood and affect. Normal behavior. Normal judgment and thought content.   Urinalysis:     Protein negative Glucose negative   Assessment and Plan:  Pregnancy: G2P0101 at 2648w0d  There are no diagnoses linked to this encounter. Preterm labor symptoms and general obstetric precautions including but not limited to vaginal bleeding, contractions, leaking of fluid and fetal movement were reviewed in detail with the patient. Please refer to After Visit Summary for other counseling recommendations.  Return in about 1 week (around 05/05/2015) for 1 wk 17 p and appt.   Eino FarberWalidah Kennith GainN Karim, CNM

## 2015-05-05 ENCOUNTER — Ambulatory Visit (INDEPENDENT_AMBULATORY_CARE_PROVIDER_SITE_OTHER): Payer: Medicaid Other | Admitting: Obstetrics & Gynecology

## 2015-05-05 ENCOUNTER — Other Ambulatory Visit (HOSPITAL_COMMUNITY)
Admission: RE | Admit: 2015-05-05 | Discharge: 2015-05-05 | Disposition: A | Discharge status: Home / Self Care | Payer: Medicaid Other | Source: Ambulatory Visit | Attending: Obstetrics & Gynecology | Admitting: Obstetrics & Gynecology

## 2015-05-05 VITALS — BP 102/67 | HR 65 | Temp 98.0°F | Wt 172.7 lb

## 2015-05-05 DIAGNOSIS — O09893 Supervision of other high risk pregnancies, third trimester: Secondary | ICD-10-CM

## 2015-05-05 DIAGNOSIS — O0993 Supervision of high risk pregnancy, unspecified, third trimester: Secondary | ICD-10-CM | POA: Diagnosis not present

## 2015-05-05 DIAGNOSIS — Z113 Encounter for screening for infections with a predominantly sexual mode of transmission: Secondary | ICD-10-CM | POA: Diagnosis not present

## 2015-05-05 DIAGNOSIS — O09213 Supervision of pregnancy with history of pre-term labor, third trimester: Secondary | ICD-10-CM | POA: Diagnosis not present

## 2015-05-05 LAB — POCT URINALYSIS DIP (DEVICE)
BILIRUBIN URINE: NEGATIVE
Glucose, UA: NEGATIVE mg/dL
Hgb urine dipstick: NEGATIVE
KETONES UR: NEGATIVE mg/dL
Nitrite: NEGATIVE
Protein, ur: NEGATIVE mg/dL
SPECIFIC GRAVITY, URINE: 1.02 (ref 1.005–1.030)
Urobilinogen, UA: 0.2 mg/dL (ref 0.0–1.0)
pH: 7 (ref 5.0–8.0)

## 2015-05-05 LAB — OB RESULTS CONSOLE GC/CHLAMYDIA: Gonorrhea: NEGATIVE

## 2015-05-05 LAB — OB RESULTS CONSOLE GBS: STREP GROUP B AG: NEGATIVE

## 2015-05-05 NOTE — Progress Notes (Signed)
Interpreter Win [employer]

## 2015-05-05 NOTE — Progress Notes (Signed)
Subjective:  [name] is a 34 y.o. G2P0101 at 718w0d being seen today for ongoing prenatal care.  Patient reports no complaints.  Contractions: Not present.  Vag. Bleeding: None. Movement: Present. Denies leaking of fluid.   The following portions of the patient's history were reviewed and updated as appropriate: allergies, current medications, past family history, past medical history, past social history, past surgical history and problem list. Problem list updated.  Objective:   Filed Vitals:   05/05/15 1049  BP: 102/67  Pulse: 65  Temp: 98 F (36.7 C)  Weight: 172 lb 11.2 oz (78.336 kg)    Fetal Status: Fetal Heart Rate (bpm): 140   Movement: Present     General:  Alert, oriented and cooperative. Patient is in no acute distress.  Skin: Skin is warm and dry. No rash noted.   Cardiovascular: Normal heart rate noted  Respiratory: Normal respiratory effort, no problems with respiration noted  Abdomen: Soft, gravid, appropriate for gestational age. Pain/Pressure: Present     Pelvic: Vag. Bleeding: None Vag D/C Character: White   Cervical exam deferred        Extremities: Normal range of motion.  Edema: None  Mental Status: Normal mood and affect. Normal behavior. Normal judgment and thought content.   Urinalysis: Urine Protein: Negative Urine Glucose: Negative  Assessment and Plan:  Pregnancy: G2P0101 at 9318w0d  1. History of preterm delivery, currently pregnant, third trimester - last 17P today - Culture, beta strep (group b only) - GC[facility]Chlamydia [facility]  Term labor symptoms and general obstetric precautions including but not limited to vaginal bleeding, contractions, leaking of fluid and fetal movement were reviewed in detail with the patient. Please refer to After Visit Summary for other counseling recommendations.  Return in about 1 week (around 05/12/2015).   Lesly DukesKelly H Meenakshi Sazama, MD

## 2015-05-06 LAB — URINE CYTOLOGY ANCILLARY ONLY
Chlamydia: NEGATIVE
Neisseria Gonorrhea: NEGATIVE

## 2015-05-07 LAB — CULTURE, BETA STREP (GROUP B ONLY)

## 2015-05-12 ENCOUNTER — Ambulatory Visit (INDEPENDENT_AMBULATORY_CARE_PROVIDER_SITE_OTHER): Payer: Medicaid Other | Admitting: Obstetrics & Gynecology

## 2015-05-12 VITALS — BP 95/62 | HR 90 | Temp 98.1°F | Wt 175.2 lb

## 2015-05-12 DIAGNOSIS — O09213 Supervision of pregnancy with history of pre-term labor, third trimester: Secondary | ICD-10-CM | POA: Diagnosis not present

## 2015-05-12 DIAGNOSIS — O09893 Supervision of other high risk pregnancies, third trimester: Secondary | ICD-10-CM

## 2015-05-12 DIAGNOSIS — O0993 Supervision of high risk pregnancy, unspecified, third trimester: Secondary | ICD-10-CM | POA: Diagnosis not present

## 2015-05-12 LAB — POCT URINALYSIS DIP (DEVICE)
Glucose, UA: NEGATIVE mg/dL
Ketones, ur: NEGATIVE mg/dL
NITRITE: NEGATIVE
PH: 7 (ref 5.0–8.0)
Protein, ur: 30 mg/dL — AB
Specific Gravity, Urine: 1.02 (ref 1.005–1.030)
Urobilinogen, UA: 0.2 mg/dL (ref 0.0–1.0)

## 2015-05-12 NOTE — Patient Instructions (Signed)
Return to clinic for any obstetric concerns or go to [facility] for evaluation  

## 2015-05-12 NOTE — Progress Notes (Signed)
Subjective:  [name] is a 34 y.o. G2P0101 at 6289w0d being seen today for ongoing prenatal care.  Burmese interpreter present.  Patient reports no complaints.  Contractions: Irregular.  Vag. Bleeding: None. Movement: Present. Denies leaking of fluid.   The following portions of the patient's history were reviewed and updated as appropriate: allergies, current medications, past family history, past medical history, past social history, past surgical history and problem list. Problem list updated.  Objective:   Filed Vitals:   05/12/15 1100  BP: 95/62  Pulse: 90  Temp: 98.1 F (36.7 C)  Weight: 175 lb 3.2 oz (79.47 kg)    Fetal Status: Fetal Heart Rate (bpm): 160 Fundal Height: 37 cm Movement: Present     General:  Alert, oriented and cooperative. Patient is in no acute distress.  Skin: Skin is warm and dry. No rash noted.   Cardiovascular: Normal heart rate noted  Respiratory: Normal respiratory effort, no problems with respiration noted  Abdomen: Soft, gravid, appropriate for gestational age. Pain/Pressure: Present     Pelvic: Vag. Bleeding: None Vag D/C Character: White   Cervical exam deferred        Extremities: Normal range of motion.  Edema: None  Mental Status: Normal mood and affect. Normal behavior. Normal judgment and thought content.   Urinalysis: Urine Protein: Negative Urine Glucose: Negative  Assessment and Plan:  Pregnancy: G2P0101 at 3989w0d  1. History of preterm delivery, currently pregnant, third trimester Finished 17P regimen last week.  2. Supervision of high risk pregnancy, antepartum, third trimester Negative GBS, GC/Chlam. Preterm labor symptoms and general obstetric precautions including but not limited to vaginal bleeding, contractions, leaking of fluid and fetal movement were reviewed in detail with the patient. Please refer to After Visit Summary for other counseling recommendations.  Return in about 1 week (around 05/19/2015) for OB  Visit.   Tereso NewcomerUgonna A Abaigeal Moomaw, MD

## 2015-05-19 ENCOUNTER — Inpatient Hospital Stay (HOSPITAL_COMMUNITY)
Admission: AD | Admit: 2015-05-19 | Discharge: 2015-05-19 | Disposition: A | Discharge status: Home / Self Care | Payer: Medicaid Other | Source: Ambulatory Visit | Attending: Family Medicine | Admitting: Family Medicine

## 2015-05-19 ENCOUNTER — Encounter (HOSPITAL_COMMUNITY): Payer: Self-pay | Admitting: *Deleted

## 2015-05-19 ENCOUNTER — Ambulatory Visit (INDEPENDENT_AMBULATORY_CARE_PROVIDER_SITE_OTHER): Payer: Medicaid Other | Admitting: Family Medicine

## 2015-05-19 ENCOUNTER — Inpatient Hospital Stay (HOSPITAL_COMMUNITY)
Admission: AD | Admit: 2015-05-19 | Discharge: 2015-05-21 | DRG: 775 | Disposition: A | Discharge status: Home / Self Care | Payer: Medicaid Other | Source: Ambulatory Visit | Attending: Family Medicine | Admitting: Family Medicine

## 2015-05-19 VITALS — BP 108/67 | HR 79 | Temp 98.0°F | Wt 174.2 lb

## 2015-05-19 DIAGNOSIS — O26893 Other specified pregnancy related conditions, third trimester: Secondary | ICD-10-CM | POA: Diagnosis present

## 2015-05-19 DIAGNOSIS — Z3A38 38 weeks gestation of pregnancy: Secondary | ICD-10-CM

## 2015-05-19 DIAGNOSIS — IMO0001 Reserved for inherently not codable concepts without codable children: Secondary | ICD-10-CM

## 2015-05-19 DIAGNOSIS — O480 Post-term pregnancy: Secondary | ICD-10-CM

## 2015-05-19 DIAGNOSIS — O0993 Supervision of high risk pregnancy, unspecified, third trimester: Secondary | ICD-10-CM

## 2015-05-19 DIAGNOSIS — Z789 Other specified health status: Secondary | ICD-10-CM

## 2015-05-19 LAB — CBC
HCT: 34.7 % — ABNORMAL LOW (ref 36.0–46.0)
HEMOGLOBIN: 11.2 g/dL — AB (ref 12.0–15.0)
MCH: 25.5 pg — AB (ref 26.0–34.0)
MCHC: 32.3 g/dL (ref 30.0–36.0)
MCV: 78.9 fL (ref 78.0–100.0)
Platelets: 249 10*3/uL (ref 150–400)
RBC: 4.4 MIL/uL (ref 3.87–5.11)
RDW: 13.8 % (ref 11.5–15.5)
WBC: 14.5 10*3/uL — ABNORMAL HIGH (ref 4.0–10.5)

## 2015-05-19 LAB — POCT URINALYSIS DIP (DEVICE)
Bilirubin Urine: NEGATIVE
GLUCOSE, UA: NEGATIVE mg/dL
Ketones, ur: NEGATIVE mg/dL
NITRITE: NEGATIVE
PROTEIN: NEGATIVE mg/dL
Specific Gravity, Urine: 1.02 (ref 1.005–1.030)
UROBILINOGEN UA: 0.2 mg/dL (ref 0.0–1.0)
pH: 7 (ref 5.0–8.0)

## 2015-05-19 MED ORDER — LIDOCAINE HCL (PF) 1 % IJ SOLN
30.0000 mL | INTRAMUSCULAR | Status: DC | PRN
  Administered 2015-05-20: 30 mL via SUBCUTANEOUS
  Filled 2015-05-19 (×2): qty 30

## 2015-05-19 MED ORDER — OXYCODONE-ACETAMINOPHEN 5-325 MG PO TABS: 2.0000 | ORAL_TABLET | ORAL | Status: DC | PRN

## 2015-05-19 MED ORDER — ONDANSETRON HCL 4 MG/2ML IJ SOLN: 4.0000 mg | Freq: Four times a day (QID) | INTRAMUSCULAR | Status: DC | PRN

## 2015-05-19 MED ORDER — LACTATED RINGERS IV SOLN
INTRAVENOUS | Status: DC
Start: 2015-05-19 — End: 2015-05-20
  Administered 2015-05-19: via INTRAVENOUS

## 2015-05-19 MED ORDER — CITRIC ACID-SODIUM CITRATE 334-500 MG/5ML PO SOLN: 30.0000 mL | ORAL | Status: DC | PRN

## 2015-05-19 MED ORDER — EPHEDRINE 5 MG/ML INJ
10.0000 mg | INTRAVENOUS | Status: DC | PRN
  Filled 2015-05-19: qty 2

## 2015-05-19 MED ORDER — DIPHENHYDRAMINE HCL 50 MG/ML IJ SOLN: 12.5000 mg | INTRAMUSCULAR | Status: DC | PRN

## 2015-05-19 MED ORDER — LACTATED RINGERS IV SOLN: 500.0000 mL | INTRAVENOUS | Status: DC | PRN

## 2015-05-19 MED ORDER — FENTANYL 2.5 MCG/ML BUPIVACAINE 1/10 % EPIDURAL INFUSION (WH - ANES)
14.0000 mL/h | INTRAMUSCULAR | Status: DC | PRN
  Administered 2015-05-20: 14 mL/h via EPIDURAL
  Filled 2015-05-19: qty 125

## 2015-05-19 MED ORDER — OXYTOCIN 40 UNITS IN LACTATED RINGERS INFUSION - SIMPLE MED
62.5000 mL/h | INTRAVENOUS | Status: DC
  Filled 2015-05-19 (×2): qty 1000

## 2015-05-19 MED ORDER — OXYCODONE-ACETAMINOPHEN 5-325 MG PO TABS: 1.0000 | ORAL_TABLET | ORAL | Status: DC | PRN

## 2015-05-19 MED ORDER — FENTANYL CITRATE (PF) 100 MCG/2ML IJ SOLN
50.0000 ug | INTRAMUSCULAR | Status: DC | PRN
  Administered 2015-05-19: 50 ug via INTRAVENOUS
  Filled 2015-05-19: qty 2

## 2015-05-19 MED ORDER — ACETAMINOPHEN 325 MG PO TABS: 650.0000 mg | ORAL_TABLET | ORAL | Status: DC | PRN

## 2015-05-19 MED ORDER — OXYTOCIN BOLUS FROM INFUSION: 500.0000 mL | INTRAVENOUS | Status: DC

## 2015-05-19 MED ORDER — PHENYLEPHRINE 40 MCG/ML (10ML) SYRINGE FOR IV PUSH (FOR BLOOD PRESSURE SUPPORT)
80.0000 ug | PREFILLED_SYRINGE | INTRAVENOUS | Status: DC | PRN
  Filled 2015-05-19: qty 2
  Filled 2015-05-19: qty 20

## 2015-05-19 MED ORDER — FLEET ENEMA 7-19 GM/118ML RE ENEM: 1.0000 | ENEMA | RECTAL | Status: DC | PRN

## 2015-05-19 NOTE — Discharge Instructions (Signed)
Braxton Hicks Contractions °Contractions of the uterus can occur throughout pregnancy. Contractions are not always a sign that you are in labor.  °WHAT ARE BRAXTON HICKS CONTRACTIONS?  °Contractions that occur before labor are called Braxton Hicks contractions, or false labor. Toward the end of pregnancy (32-34 weeks), these contractions can develop more often and may become more forceful. This is not true labor because these contractions do not result in opening (dilatation) and thinning of the cervix. They are sometimes difficult to tell apart from true labor because these contractions can be forceful and people have different pain tolerances. You should not feel embarrassed if you go to the hospital with false labor. Sometimes, the only way to tell if you are in true labor is for your health care provider to look for changes in the cervix. °If there are no prenatal problems or other health problems associated with the pregnancy, it is completely safe to be sent home with false labor and await the onset of true labor. °HOW CAN YOU TELL THE DIFFERENCE BETWEEN TRUE AND FALSE LABOR? °False Labor °· The contractions of false labor are usually shorter and not as hard as those of true labor.   °· The contractions are usually irregular.   °· The contractions are often felt in the front of the lower abdomen and in the groin.   °· The contractions may go away when you walk around or change positions while lying down.   °· The contractions get weaker and are shorter lasting as time goes on.   °· The contractions do not usually become progressively stronger, regular, and closer together as with true labor.   °True Labor °1. Contractions in true labor last 30-70 seconds, become very regular, usually become more intense, and increase in frequency.   °2. The contractions do not go away with walking.   °3. The discomfort is usually felt in the top of the uterus and spreads to the lower abdomen and low back.   °4. True labor can  be determined by your health care provider with an exam. This will show that the cervix is dilating and getting thinner.   °WHAT TO REMEMBER °· Keep up with your usual exercises and follow other instructions given by your health care provider.   °· Take medicines as directed by your health care provider.   °· Keep your regular prenatal appointments.   °· Eat and drink lightly if you think you are going into labor.   °· If Braxton Hicks contractions are making you uncomfortable:   °· Change your position from lying down or resting to walking, or from walking to resting.   °· Sit and rest in a tub of warm water.   °· Drink 2-3 glasses of water. Dehydration may cause these contractions.   °· Do slow and deep breathing several times an hour.   °WHEN SHOULD I SEEK IMMEDIATE MEDICAL CARE? °Seek immediate medical care if: °· Your contractions become stronger, more regular, and closer together.   °· You have fluid leaking or gushing from your vagina.   °· You have a fever.   °· You pass blood-tinged mucus.   °· You have vaginal bleeding.   °· You have continuous abdominal pain.   °· You have low back pain that you never had before.   °· You feel your baby's head pushing down and causing pelvic pressure.   °· Your baby is not moving as much as it used to.   °  °This information is not intended to replace advice given to you by your health care provider. Make sure you discuss any questions you have with your health care   provider. °  °Document Released: 06/25/2005 Document Revised: 06/30/2013 Document Reviewed: 04/06/2013 °Elsevier Interactive Patient Education ©2016 Else[employer] Inc[employer] ° °Fetal Movement Counts °Patient Name: __________________________________________________ Patient Due Date: ____________________ °Performing a fetal movement count is highly recommended in high-risk pregnancies, but it is good for every pregnant woman to do. Your health care provider may ask you to start counting fetal movements at 28 weeks of the  pregnancy. Fetal movements often increase: °· After eating a full meal. °· After physical activity. °· After eating or drinking something sweet or cold. °· At rest. °Pay attention to when you feel the baby is most active. This will help you notice a pattern of your baby's sleep and wake cycles and what factors contribute to an increase in fetal movement. It is important to perform a fetal movement count at the same time each day when your baby is normally most active.  °HOW TO COUNT FETAL MOVEMENTS °5. Find a quiet and comfortable area to sit or lie down on your left side. Lying on your left side provides the best blood and oxygen circulation to your baby. °6. Write down the day and time on a sheet of paper or in a journal. °7. Start counting kicks, flutters, swishes, rolls, or jabs in a 2-hour period. You should feel at least 10 movements within 2 hours. °8. If you do not feel 10 movements in 2 hours, wait 2-3 hours and count again. Look for a change in the pattern or not enough counts in 2 hours. °SEEK MEDICAL CARE IF: °· You feel less than 10 counts in 2 hours, tried twice. °· There is no movement in over an hour. °· The pattern is changing or taking longer each day to reach 10 counts in 2 hours. °· You feel the baby is not moving as he or she usually does. °Date: ____________ Movements: ____________ Start time: ____________ Finish time: ____________  °Date: ____________ Movements: ____________ Start time: ____________ Finish time: ____________ °Date: ____________ Movements: ____________ Start time: ____________ Finish time: ____________ °Date: ____________ Movements: ____________ Start time: ____________ Finish time: ____________ °Date: ____________ Movements: ____________ Start time: ____________ Finish time: ____________ °Date: ____________ Movements: ____________ Start time: ____________ Finish time: ____________ °Date: ____________ Movements: ____________ Start time: ____________ Finish time:  ____________ °Date: ____________ Movements: ____________ Start time: ____________ Finish time: ____________  °Date: ____________ Movements: ____________ Start time: ____________ Finish time: ____________ °Date: ____________ Movements: ____________ Start time: ____________ Finish time: ____________ °Date: ____________ Movements: ____________ Start time: ____________ Finish time: ____________ °Date: ____________ Movements: ____________ Start time: ____________ Finish time: ____________ °Date: ____________ Movements: ____________ Start time: ____________ Finish time: ____________ °Date: ____________ Movements: ____________ Start time: ____________ Finish time: ____________ °Date: ____________ Movements: ____________ Start time: ____________ Finish time: ____________  °Date: ____________ Movements: ____________ Start time: ____________ Finish time: ____________ °Date: ____________ Movements: ____________ Start time: ____________ Finish time: ____________ °Date: ____________ Movements: ____________ Start time: ____________ Finish time: ____________ °Date: ____________ Movements: ____________ Start time: ____________ Finish time: ____________ °Date: ____________ Movements: ____________ Start time: ____________ Finish time: ____________ °Date: ____________ Movements: ____________ Start time: ____________ Finish time: ____________ °Date: ____________ Movements: ____________ Start time: ____________ Finish time: ____________  °Date: ____________ Movements: ____________ Start time: ____________ Finish time: ____________ °Date: ____________ Movements: ____________ Start time: ____________ Finish time: ____________ °Date: ____________ Movements: ____________ Start time: ____________ Finish time: ____________ °Date: ____________ Movements: ____________ Start time: ____________ Finish time: ____________ °Date: ____________ Movements: ____________ Start time: ____________ Finish time: ____________ °Date: ____________ Movements:  ____________ Start time: ____________ Finish   time: ____________ °Date: ____________ Movements: ____________ Start time: ____________ Finish time: ____________  °Date: ____________ Movements: ____________ Start time: ____________ Finish time: ____________ °Date: ____________ Movements: ____________ Start time: ____________ Finish time: ____________ °Date: ____________ Movements: ____________ Start time: ____________ Finish time: ____________ °Date: ____________ Movements: ____________ Start time: ____________ Finish time: ____________ °Date: ____________ Movements: ____________ Start time: ____________ Finish time: ____________ °Date: ____________ Movements: ____________ Start time: ____________ Finish time: ____________ °Date: ____________ Movements: ____________ Start time: ____________ Finish time: ____________  °Date: ____________ Movements: ____________ Start time: ____________ Finish time: ____________ °Date: ____________ Movements: ____________ Start time: ____________ Finish time: ____________ °Date: ____________ Movements: ____________ Start time: ____________ Finish time: ____________ °Date: ____________ Movements: ____________ Start time: ____________ Finish time: ____________ °Date: ____________ Movements: ____________ Start time: ____________ Finish time: ____________ °Date: ____________ Movements: ____________ Start time: ____________ Finish time: ____________ °Date: ____________ Movements: ____________ Start time: ____________ Finish time: ____________  °Date: ____________ Movements: ____________ Start time: ____________ Finish time: ____________ °Date: ____________ Movements: ____________ Start time: ____________ Finish time: ____________ °Date: ____________ Movements: ____________ Start time: ____________ Finish time: ____________ °Date: ____________ Movements: ____________ Start time: ____________ Finish time: ____________ °Date: ____________ Movements: ____________ Start time: ____________ Finish  time: ____________ °Date: ____________ Movements: ____________ Start time: ____________ Finish time: ____________ °Date: ____________ Movements: ____________ Start time: ____________ Finish time: ____________  °Date: ____________ Movements: ____________ Start time: ____________ Finish time: ____________ °Date: ____________ Movements: ____________ Start time: ____________ Finish time: ____________ °Date: ____________ Movements: ____________ Start time: ____________ Finish time: ____________ °Date: ____________ Movements: ____________ Start time: ____________ Finish time: ____________ °Date: ____________ Movements: ____________ Start time: ____________ Finish time: ____________ °Date: ____________ Movements: ____________ Start time: ____________ Finish time: ____________ °  °This information is not intended to replace advice given to you by your health care provider. Make sure you discuss any questions you have with your health care provider. °  °Document Released: 07/25/2006 Document Revised: 07/16/2014 Document Reviewed: 04/21/2012 °Elsevier Interactive Patient Education ©2016 Elsevier Inc. ° °

## 2015-05-19 NOTE — Progress Notes (Signed)
Subjective:  [name] is a 34 y.o. G2P0101 at 4410w0d being seen today for ongoing prenatal care.  Patient reports constant leaking fluid since yesterday.  Contractions: Not present.  Vag. Bleeding: None. Movement: Present. Denies leaking of fluid.   The following portions of the patient's history were reviewed and updated as appropriate: allergies, current medications, past family history, past medical history, past social history, past surgical history and problem list. Problem list updated.  Objective:   Filed Vitals:   05/19/15 1126  BP: 108/67  Pulse: 79  Temp: 98 F (36.7 C)  Weight: 174 lb 3.2 oz (79.017 kg)    Fetal Status: Fetal Heart Rate (bpm): 145   Movement: Present     General:  Alert, oriented and cooperative. Patient is in no acute distress.  Skin: Skin is warm and dry. No rash noted.   Cardiovascular: Normal heart rate noted  Respiratory: Normal respiratory effort, no problems with respiration noted  Abdomen: Soft, gravid, appropriate for gestational age. Pain/Pressure: Present     Pelvic: Vag. Bleeding: None Vag D/C Character: Watery   Cervical exam performed Dilation: 4 Effacement (%): 70 Station: -2  Extremities: Normal range of motion.  Edema: None  Mental Status: Normal mood and affect. Normal behavior. Normal judgment and thought content.   Urinalysis: Urine Protein: Trace Urine Glucose: Negative  Assessment and Plan:  Pregnancy: G2P0101 at 6710w0d  1. Supervision of high risk pregnancy, antepartum, third trimester FHT, FH normal.  Watery mucus discharge.  No pooling, neg fern  2. Language barrier, speaks Burnese only   Term labor symptoms and general obstetric precautions including but not limited to vaginal bleeding, contractions, leaking of fluid and fetal movement were reviewed in detail with the patient. Please refer to After Visit Summary for other counseling recommendations.  No Follow-up on file.   Levie HeritageJacob J Stinson, DO

## 2015-05-19 NOTE — H&P (Signed)
[name] is a 34 y.o. female G2P0101 with IUP at [redacted]w[redacted]d presenting for active labo4r.She was sent home from MAU 3 hours ago with q 10 minute ctx, with instructions to return when they got closer together. They are now q 3-40mintues, associated with none vaginal bleeding.  Membranes are ruptured, clear fluid, with active fetal movement.   [facility]  Prenatal History/Complications: None  Past Medical History: Past Medical History  Diagnosis Date  . Headache(784.0)   . 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     Past Surgical History: Past Surgical History  Procedure Laterality Date  . No past surgeries      Obstetrical History: OB History    Gravida Para Term Preterm AB TAB SAB Ectopic Multiple Living         Social History: Social History   Social History  . Marital Status: Married    Spouse Name: N/A  . Number of Children: N/A  . Years of Education: N/A   Social History Main Topics  . Smoking status: Never Smoker   . Smokeless tobacco: Never Used  . Alcohol Use: No  . Drug Use: No  . Sexual Activity: Yes    Birth Control/ Protection: None   Other Topics Concern  . Not on file   Social History Narrative    Family History: Family History  Problem Relation Age of Onset  . Anesthesia problems Neg Hx     Allergies: No Known Allergies  Prescriptions prior to admission  Medication Sig Dispense Refill Last Dose  . FOLIC ACID PO Take 1 tablet by mouth daily.   05/18/2015 at Unknown time     Prenatal Transfer Tool  Maternal Diabetes: No Genetic Screening: Normal Maternal Ultrasounds/Referrals: Normal Fetal Ultrasounds or other Referrals:  None Maternal Substance Abuse:  No Significant Maternal Medications:  None Significant Maternal Lab Results: Lab values include: Group B Strep negative     Review of Systems   Constitutional: Negative for fever and chills Eyes: Negative for  visual disturbances Respiratory: Negative for shortness of breath, dyspnea Cardiovascular: Negative for chest pain or palpitations  Gastrointestinal: Negative for vomiting, diarrhea and constipation.  POSITIVE for abdominal pain (contractions) Genitourinary: Negative for dysuria and urgency Musculoskeletal: Negative for back pain, joint pain, myalgias  Neurological: Negative for dizziness and headaches      Blood pressure 129/79, pulse 73, temperature 97.9 F (36.6 C), resp. rate 20, last menstrual period 08/26/2014, SpO2 99 %, unknown if currently breastfeeding. General appearance: alert, cooperative and mild distress Lungs: clear to auscultation bilaterally Heart: regular rate and rhythm Abdomen: soft, non-tender; bowel sounds normal Extremities: Homans sign is negative, no sign of DVT DTR's 2+ Presentation: cephalic Fetal monitoring  Baseline: 145 bpm, Variability: Good {> 6 bpm), Accelerations: Reactive and Decelerations: Absent Uterine activity  Moderate, q 3-5 minutes  Dilation: 6 Effacement (%): 90 Station: -2 Exam by:: Weston,RN Leaking clear fluid  Prenatal labs: ABO, Rh: O/POS/-- (05/05 1515) Antibody: NEG (05/05 1515) Rubella: !Error! RPR: NON REAC (08/18 1408)  HBsAg: NEGATIVE (05/05 1515)  HIV: NONREACTIVE (08/18 1408)  Clinic  HR Clinic Prenatal Labs  Dating LMP consistent with 9 wk [place] Blood type:   O+  Genetic Screen 1 Screen: NT nml     Quad: neg Antibody: Neg  Anatomic [place] Normal Rubella:  Immune  GTT Early: 115  Third trimester: 123 RPR:   Nonreactive  Flu vaccine 11/11/14, 03/24/15 HBsAg:   Negative  TDaP vaccine  02/24/15                                            HIV:   Nonreactive  GBS  neg                                    GBS: neg  Contraception  Considering depo Pap:  Negative  Baby Food  Breast   Circumcision N/A   [facility]ian  [facility]   Support Person  Husband Rosita Fire(Ye)       Results for orders placed or performed in  visit on 05/19/15 (from the past 24 hour(s))  POCT urinalysis dip (device)   Collection Time: 05/19/15 11:39 AM  Result Value Ref Range   Glucose, UA NEGATIVE NEGATIVE mg/dL   Bilirubin Urine NEGATIVE NEGATIVE   Ketones, ur NEGATIVE NEGATIVE mg/dL   Specific Gravity, Urine 1.020 1.005 - 1.030   Hgb urine dipstick MODERATE (A) NEGATIVE   pH 7.0 5.0 - 8.0   Protein, ur NEGATIVE NEGATIVE mg/dL   Urobilinogen, UA 0.2 0.0 - 1.0 mg/dL   Nitrite NEGATIVE NEGATIVE   Leukocytes, UA SMALL (A) NEGATIVE    Assessment: [name] is a 34 y.o. G2P0101 with an IUP at 7453w0d presenting for active labor  Plan: #Labor: expectant management #Pain:  epidural #FWB cat 1 #ID: GBS: neg  #MOF:  breast #MOC: depo   [name],[name] 05/19/2015, 11:16 PM

## 2015-05-19 NOTE — Patient Instructions (Signed)

## 2015-05-19 NOTE — Progress Notes (Signed)
Breastfeeding tip of the week reviewed Patient complains of fluid leaking since yesterday Urine: trace hgb, small amt wbc Interpreter present

## 2015-05-19 NOTE — MAU Note (Signed)
Pt presents to MAU with complaints of contractions that started this morning around 11. Pt was checked at the office and was 4 cms dilated

## 2015-05-19 NOTE — Progress Notes (Addendum)
[place] interpreter Toniann FailWendy [phone] used from time of patient arrival to birthing suites through epidural placement & pain relief achieved.  Wolfgang PhoenixLeigha Montrez Mari[place] RN

## 2015-05-20 ENCOUNTER — Encounter (HOSPITAL_COMMUNITY): Payer: Self-pay | Admitting: Anesthesiology

## 2015-05-20 ENCOUNTER — Inpatient Hospital Stay (HOSPITAL_COMMUNITY): Payer: Medicaid Other | Admitting: Anesthesiology

## 2015-05-20 DIAGNOSIS — Z3A38 38 weeks gestation of pregnancy: Secondary | ICD-10-CM

## 2015-05-20 LAB — RPR: RPR: NONREACTIVE

## 2015-05-20 MED ORDER — METHYLERGONOVINE MALEATE 0.2 MG PO TABS: 0.2000 mg | ORAL_TABLET | ORAL | Status: DC | PRN

## 2015-05-20 MED ORDER — DIBUCAINE 1 % RE OINT: 1.0000 "application " | TOPICAL_OINTMENT | RECTAL | Status: DC | PRN

## 2015-05-20 MED ORDER — ONDANSETRON HCL 4 MG/2ML IJ SOLN: 4.0000 mg | INTRAMUSCULAR | Status: DC | PRN

## 2015-05-20 MED ORDER — DIPHENHYDRAMINE HCL 25 MG PO CAPS: 25.0000 mg | ORAL_CAPSULE | Freq: Four times a day (QID) | ORAL | Status: DC | PRN

## 2015-05-20 MED ORDER — PRENATAL MULTIVITAMIN CH
1.0000 | ORAL_TABLET | Freq: Every day | ORAL | Status: DC
  Administered 2015-05-20: 1 via ORAL
  Filled 2015-05-20: qty 1

## 2015-05-20 MED ORDER — SENNOSIDES-DOCUSATE SODIUM 8.6-50 MG PO TABS
2.0000 | ORAL_TABLET | ORAL | Status: DC
  Administered 2015-05-21: 2 via ORAL
  Filled 2015-05-20: qty 2

## 2015-05-20 MED ORDER — FLEET ENEMA 7-19 GM/118ML RE ENEM: 1.0000 | ENEMA | Freq: Every day | RECTAL | Status: DC | PRN

## 2015-05-20 MED ORDER — OXYTOCIN 40 UNITS IN LACTATED RINGERS INFUSION - SIMPLE MED: 62.5000 mL/h | INTRAVENOUS | Status: DC | PRN

## 2015-05-20 MED ORDER — ACETAMINOPHEN 325 MG PO TABS: 650.0000 mg | ORAL_TABLET | ORAL | Status: DC | PRN

## 2015-05-20 MED ORDER — METHYLERGONOVINE MALEATE 0.2 MG/ML IJ SOLN: 0.2000 mg | INTRAMUSCULAR | Status: DC | PRN

## 2015-05-20 MED ORDER — OXYCODONE-ACETAMINOPHEN 5-325 MG PO TABS: 2.0000 | ORAL_TABLET | ORAL | Status: DC | PRN

## 2015-05-20 MED ORDER — WITCH HAZEL-GLYCERIN EX PADS: 1.0000 "application " | MEDICATED_PAD | CUTANEOUS | Status: DC | PRN

## 2015-05-20 MED ORDER — ZOLPIDEM TARTRATE 5 MG PO TABS
5.0000 mg | ORAL_TABLET | Freq: Every evening | ORAL | Status: DC | PRN
Start: 2015-05-20 — End: 2015-05-21

## 2015-05-20 MED ORDER — LIDOCAINE HCL (PF) 1 % IJ SOLN
INTRAMUSCULAR | Status: DC | PRN
Start: 2015-05-20 — End: 2015-05-20
  Administered 2015-05-20 (×2): 4 mL via EPIDURAL

## 2015-05-20 MED ORDER — LANOLIN HYDROUS EX OINT: TOPICAL_OINTMENT | CUTANEOUS | Status: DC | PRN

## 2015-05-20 MED ORDER — OXYCODONE-ACETAMINOPHEN 5-325 MG PO TABS
1.0000 | ORAL_TABLET | ORAL | Status: DC | PRN
Start: 2015-05-20 — End: 2015-05-21

## 2015-05-20 MED ORDER — SIMETHICONE 80 MG PO CHEW: 80.0000 mg | CHEWABLE_TABLET | ORAL | Status: DC | PRN

## 2015-05-20 MED ORDER — ONDANSETRON HCL 4 MG PO TABS: 4.0000 mg | ORAL_TABLET | ORAL | Status: DC | PRN

## 2015-05-20 MED ORDER — IBUPROFEN 600 MG PO TABS
600.0000 mg | ORAL_TABLET | Freq: Four times a day (QID) | ORAL | Status: DC
  Administered 2015-05-20 – 2015-05-21 (×6): 600 mg via ORAL
  Filled 2015-05-20 (×6): qty 1

## 2015-05-20 MED ORDER — BENZOCAINE-MENTHOL 20-0.5 % EX AERO
1.0000 "application " | INHALATION_SPRAY | CUTANEOUS | Status: DC | PRN
  Administered 2015-05-20: 1 via TOPICAL
  Filled 2015-05-20: qty 56

## 2015-05-20 MED ORDER — TETANUS-DIPHTH-ACELL PERTUSSIS 5-2.5-18.5 LF-MCG/0.5 IM SUSP: 0.5000 mL | Freq: Once | INTRAMUSCULAR | Status: DC

## 2015-05-20 MED ORDER — MEASLES, MUMPS & RUBELLA VAC ~~LOC~~ INJ
0.5000 mL | INJECTION | Freq: Once | SUBCUTANEOUS | Status: DC
  Filled 2015-05-20: qty 0.5

## 2015-05-20 MED ORDER — FERROUS SULFATE 325 (65 FE) MG PO TABS
325.0000 mg | ORAL_TABLET | Freq: Two times a day (BID) | ORAL | Status: DC
  Administered 2015-05-20 – 2015-05-21 (×3): 325 mg via ORAL
  Filled 2015-05-20 (×3): qty 1

## 2015-05-20 MED ORDER — BISACODYL 10 MG RE SUPP: 10.0000 mg | Freq: Every day | RECTAL | Status: DC | PRN

## 2015-05-20 NOTE — Progress Notes (Signed)
Patient refuses urethral catheter. Necessity explained to patient through interpreter. Wolfgang PhoenixLeigha Yaa Donnellan RN

## 2015-05-20 NOTE — Anesthesia Procedure Notes (Signed)
Epidural Patient location during procedure: OB Start time: 05/20/2015 12:15 AM  Staffing Anesthesiologist: Mal AmabileFOSTER, Xianna Siverling Performed by: anesthesiologist   Preanesthetic Checklist Completed: patient identified, site marked, surgical consent, pre-op evaluation, timeout performed, IV checked, risks and benefits discussed and monitors and equipment checked  Epidural Patient position: sitting Prep: site prepped and draped and DuraPrep Patient monitoring: continuous pulse ox and blood pressure Approach: midline Location: L3-L4 Injection technique: LOR air  Needle:  Needle type: Tuohy  Needle gauge: 17 G Needle length: 9 cm and 9 Needle insertion depth: 4 cm Catheter type: closed end flexible Catheter size: 19 Gauge Catheter at skin depth: 9 cm Test dose: negative and Other  Assessment Events: blood not aspirated, injection not painful, no injection resistance, negative IV test and no paresthesia  Additional Notes Patient identified. Risks and benefits discussed including failed block, incomplete  Pain control, post dural puncture headache, nerve damage, paralysis, blood pressure Changes, nausea, vomiting, reactions to medications-both toxic and allergic and post Partum back pain. All questions were answered. Patient expressed understanding and wished to proceed. Sterile technique was used throughout procedure. Epidural site was Dressed with sterile barrier dressing. No paresthesias, signs of intravascular injection Or signs of intrathecal spread were encountered.  Patient was more comfortable after the epidural was dosed. Please see RN's note for documentation of vital signs and FHR which are stable.

## 2015-05-20 NOTE — Lactation Note (Signed)
This note was copied from the chart of Sierra [employer] [place]. Lactation Consultation Note  Patient Name: [name] ZOXWR'UToday's Date: 05/20/2015 Reason for consult: Initial assessment    With this mom of a full term baby, now 5712 hours old, . Mom has breast fed baby twice already, and denies any questions/concerns. I advised mom to do skin to skin to keep baby interested in latching. Mom said she already  did skin to skin for 1 hour after bath. I explained that skin to skin during the first few days of life will increase, the baby wanting to feed. I assisted mom with trying to latch the baby in cross cradle, but the baby was not interested in latching, very fussy, so left skin to skin with mom. . Mom more comofrtable with cradle hold, and denies any questions/concerns. I spoke to mom through a visitor in the rom, who spoke [place], [facility]. She did not want me to call a phone interpreter. Mom knows to call for lactation as needed.   Maternal Data Formula Feeding for Exclusion: No Has patient been taught Hand Expression?: Yes Does the patient have breastfeeding experience prior to this delivery?: Yes  Feeding Feeding Type: Breast Fed  LATCH Score/Interventions Latch: Too sleepy or reluctant, no latch achieved, no sucking elicited. (woke baby up to latch - mom did skin to skin, baby not intersted in lataching now - mom fed 2 hours ago) Intervention(s): Skin to skin;Teach feeding cues;Waking techniques  Audible Swallowing: None (mom has easily expressed colostrum) Intervention(s): Skin to skin;Hand expression  Type of Nipple: Everted at rest and after stimulation  Comfort (Breast/Nipple): Soft / non-tender     Hold (Positioning): Assistance needed to correctly position infant at breast and maintain latch. Intervention(s): Breastfeeding basics reviewed;Support Pillows;Position options;Skin to skin  LATCH Score: 5  Lactation Tools Discussed/Used     Consult Status Consult Status:  Follow-up Date: 05/21/15 Follow-up type: In-patient    Alfred LevinsLee, Imaad Reuss Anne 05/20/2015, 3:09 PM

## 2015-05-20 NOTE — Anesthesia Postprocedure Evaluation (Signed)
  Anesthesia Post-op Note  Patient: Jerelyn Charlesang Yi Yi Win Marzella  Procedure(s) Performed: * No procedures listed *  Patient Location: Mother/Baby  Anesthesia Type:Epidural  Level of Consciousness: awake, alert  and oriented  Airway and Oxygen Therapy: Patient Spontanous Breathing  Post-op Pain: none  Post-op Assessment: Post-op Vital signs reviewed, Patient's Cardiovascular Status Stable, Respiratory Function Stable, Patent Airway, No signs of Nausea or vomiting, Adequate PO intake, Pain level controlled, No headache, No backache and Patient able to bend at knees              Post-op Vital Signs: Reviewed and stable  Last Vitals:  Filed Vitals:   05/20/15 0600  BP: 102/78  Pulse: 90  Temp: 37.6 C  Resp: 20    Complications: No apparent anesthesia complications

## 2015-05-20 NOTE — Anesthesia Preprocedure Evaluation (Signed)
Anesthesia Evaluation  Patient identified by MRN, date of birth, ID band Patient awake    Reviewed: Allergy & Precautions, Patient's Chart, lab work & pertinent test results  Airway Mallampati: II  TM Distance: >3 FB Neck ROM: Full    Dental no notable dental hx. (+) Teeth Intact   Pulmonary  PPD +   Pulmonary exam normal breath sounds clear to auscultation       Cardiovascular negative cardio ROS Normal cardiovascular exam Rhythm:Regular Rate:Normal     Neuro/Psych  Headaches, negative psych ROS   GI/Hepatic negative GI ROS, Neg liver ROS,   Endo/Other  Obesity  Renal/GU negative Renal ROS  negative genitourinary   Musculoskeletal negative musculoskeletal ROS (+)   Abdominal (+) + obese,   Peds  Hematology  (+) anemia ,   Anesthesia Other Findings   Reproductive/Obstetrics (+) Pregnancy                             Anesthesia Physical Anesthesia Plan  ASA: II  Anesthesia Plan: Epidural   Post-op Pain Management:    Induction:   Airway Management Planned: Natural Airway  Additional Equipment:   Intra-op Plan:   Post-operative Plan:   Informed Consent: I have reviewed the patients History and Physical, chart, labs and discussed the procedure including the risks, benefits and alternatives for the proposed anesthesia with the patient or authorized representative who has indicated his/her understanding and acceptance.     Plan Discussed with: Anesthesiologist  Anesthesia Plan Comments:         Anesthesia Quick Evaluation

## 2015-05-20 NOTE — Progress Notes (Signed)
UR chart review completed.  

## 2015-05-21 MED ORDER — PRENATAL PLUS 27-1 MG PO TABS: 1.0000 | ORAL_TABLET | Freq: Every day | ORAL | Status: DC

## 2015-05-21 MED ORDER — IBUPROFEN 600 MG PO TABS: 600.0000 mg | ORAL_TABLET | Freq: Four times a day (QID) | ORAL | Status: DC | PRN

## 2015-05-21 NOTE — Discharge Summary (Signed)
Obstetric Discharge Summary Reason for Admission: onset of labor Intrapartum Procedures: spontaneous vaginal delivery Postpartum Procedures: none Complications-Operative and Postpartum: none  [name] is a 34 y.o. female G2P0101 with an uncomplicated IUP at 3938w1d who presented on 05/19/2015. She was assessed to be in active labor and was subsequently admitted to the Labor & Delivery unit. An epidural was placed at the patient's request. She progressed as expected with spontaneous rupture of membranes and without issue or augmentation. At 2:43 AM she delivered a healthy baby girl via uncomplicated spontaneous vaginal delivery (LOA). Cord clamping was delayed and third stage of labor proceeded as expected with spontaneous delivery of the placenta and subsequent hemostasis.  Placenta status: intact Complications: none  Anesthesia: Epidural  Episiotomy: None Lacerations: 2nd degree perineal; 1st degree R labial Suture Repair: 3.0 vicryl Est. Blood Loss (mL): 250 cc  Mom to postpartum. Baby to Couplet care / Skin to Skin.  Hospital Course:  Active Problems:   Active labor  [place]ese interpretor used.   [name] is a 34 y.o. [id number] s/p SVD.  Patient was admitted to obstetrics service.  She has postpartum course that was uncomplicated including no problems with ambulating, PO intake, urination, pain, or bleeding. The pt feels ready to go home and  will be discharged with outpatient follow-up.   Today: No acute events overnight.  Pt denies problems with ambulating, voiding or po intake.  She denies nausea or vomiting.  Pain is well controlled.  She has had flatus. She has not had bowel movement.  Lochia Minimal.  Plan for birth control is  Depo-Provera.  Method of Feeding: Breast  Physical Exam:  General: alert, cooperative and no distress Lochia: appropriate Uterine Fundus: firm, non tender and below the umbilicus  DVT Evaluation: No evidence of DVT seen on physical  exam. Chest: CTAB, no m/r/g Lungs: CTAB, no w/c/r  H/H: Lab Results  Component Value Date/Time   HGB 11.2* 05/19/2015 11:41 PM   HCT 34.7* 05/19/2015 11:41 PM    Discharge Diagnoses: Term Pregnancy-delivered  NSVD  Patient is stable no concerns for decompensation or infection. Patient counseled on postpartum care and return precautions. Expresses understanding w/ help of [place]ese interpreter.  Baby is being kept on the pediatrics service due to knee laxity which will be futher investigated today.  Discharge Information: Date: 05/21/2015 Activity: pelvic rest Diet: routine  Medications: None Breast feeding:  yes Condition: stable Instructions: refer to handout Discharge to: home    Medication List    STOP taking these medications        FOLIC ACID PO      TAKE these medications        ibuprofen 600 MG tablet  Commonly known as:  ADVIL,MOTRIN  Take 1 tablet (600 mg total) by mouth every 6 (six) hours as needed.     prenatal vitamin w/FE, FA 27-1 MG Tabs tablet  Take 1 tablet by mouth daily.          Future Appointments Date Time Provider Department Center  05/26/2015 11:15 AM Adam PhenixJames G Arnold, MD [facility]  06/01/2015 11:15 AM Federico FlakeKimberly Niles Newton, MD [facility]    JohannesL Du Pisanie ,MS3  05/21/2015,10:10 AM  Evaluation and management procedures were performed by MS-3 under my supervision/collaboration. Chart reviewed, patient examined by me and I agree with management and plan. Danae Orleanseirdre C Teira Arcilla, CNM 05/21/2015 11:18 AM

## 2015-05-21 NOTE — Lactation Note (Signed)
This note was copied from the chart of Sierra [employer] Know[place]. Lactation Consultation Note  Patient Name: [name] ZOXWR'UToday's Date: 05/21/2015 Reason for consult: Follow-up assessment Language Resources [place]ese Interpreter - [name] present for visit. Mom reports BF is going well, denies concerns. FOB asked how to manage engorgement care if needed. Reviewed with parents and advised to refer to Baby N Me Booklet page 24. Hand pump given to Mom to use if needed. Encouraged Mom to continue BF with feeding ques but to try and keep baby nursing for 15-20 minutes at the breast both breasts with feedings so baby won't exhaust Mom with short 10 minutes feedings and that this would be good stimulation for milk to come in.  Mom denies other questions/concerns. Advised to call for assist/questions as needed.   Maternal Data    Feeding Feeding Type: Breast Fed Length of feed: 10 min  LATCH Score/Interventions Latch: Grasps breast easily, tongue down, lips flanged, rhythmical sucking.  Audible Swallowing: A few with stimulation  Type of Nipple: Everted at rest and after stimulation  Comfort (Breast/Nipple): Soft / non-tender     Hold (Positioning): No assistance needed to correctly position infant at breast. Intervention(s): Breastfeeding basics reviewed;Support Pillows;Position options;Skin to skin  LATCH Score: 9  Lactation Tools Discussed/Used     Consult Status Consult Status: PRN Date: 05/22/15 Follow-up type: In-patient    Alfred LevinsGranger, Thailan Sava Ann 05/21/2015, 11:20 AM

## 2015-05-26 ENCOUNTER — Encounter: Payer: Medicaid Other | Admitting: Obstetrics & Gynecology

## 2015-06-01 ENCOUNTER — Encounter: Payer: Medicaid Other | Admitting: Family Medicine

## 2015-06-27 ENCOUNTER — Ambulatory Visit: Payer: Medicaid Other | Admitting: Advanced Practice Midwife

## 2016-03-05 ENCOUNTER — Inpatient Hospital Stay (HOSPITAL_COMMUNITY)
Admission: AD | Admit: 2016-03-05 | Discharge: 2016-03-05 | Disposition: A | Discharge status: Home / Self Care | Payer: Medicaid Other | Source: Ambulatory Visit | Attending: Family Medicine | Admitting: Family Medicine

## 2016-03-05 ENCOUNTER — Encounter (HOSPITAL_COMMUNITY): Payer: Self-pay | Admitting: *Deleted

## 2016-03-05 DIAGNOSIS — R05 Cough: Secondary | ICD-10-CM | POA: Diagnosis present

## 2016-03-05 DIAGNOSIS — O09212 Supervision of pregnancy with history of pre-term labor, second trimester: Secondary | ICD-10-CM

## 2016-03-05 DIAGNOSIS — O9989 Other specified diseases and conditions complicating pregnancy, childbirth and the puerperium: Secondary | ICD-10-CM

## 2016-03-05 DIAGNOSIS — N949 Unspecified condition associated with female genital organs and menstrual cycle: Secondary | ICD-10-CM

## 2016-03-05 DIAGNOSIS — R102 Pelvic and perineal pain: Secondary | ICD-10-CM | POA: Diagnosis not present

## 2016-03-05 DIAGNOSIS — J069 Acute upper respiratory infection, unspecified: Secondary | ICD-10-CM | POA: Diagnosis not present

## 2016-03-05 DIAGNOSIS — O26892 Other specified pregnancy related conditions, second trimester: Secondary | ICD-10-CM | POA: Diagnosis not present

## 2016-03-05 DIAGNOSIS — Z3A14 14 weeks gestation of pregnancy: Secondary | ICD-10-CM | POA: Insufficient documentation

## 2016-03-05 DIAGNOSIS — R109 Unspecified abdominal pain: Secondary | ICD-10-CM | POA: Diagnosis present

## 2016-03-05 DIAGNOSIS — O09892 Supervision of other high risk pregnancies, second trimester: Secondary | ICD-10-CM

## 2016-03-05 DIAGNOSIS — O0932 Supervision of pregnancy with insufficient antenatal care, second trimester: Secondary | ICD-10-CM | POA: Diagnosis not present

## 2016-03-05 LAB — URINALYSIS, ROUTINE W REFLEX MICROSCOPIC
Bilirubin Urine: NEGATIVE
GLUCOSE, UA: NEGATIVE mg/dL
Hgb urine dipstick: NEGATIVE
KETONES UR: 15 mg/dL — AB
LEUKOCYTES UA: NEGATIVE
Nitrite: NEGATIVE
PH: 7 (ref 5.0–8.0)
Protein, ur: NEGATIVE mg/dL
Specific Gravity, Urine: 1.01 (ref 1.005–1.030)

## 2016-03-05 LAB — POCT PREGNANCY, URINE: Preg Test, Ur: POSITIVE — AB

## 2016-03-05 MED ORDER — PRENATAL PLUS 27-1 MG PO TABS: 1.0000 | ORAL_TABLET | Freq: Every day | ORAL | 0 refills | Status: DC

## 2016-03-05 MED ORDER — OMEPRAZOLE 20 MG PO CPDR: 20.0000 mg | DELAYED_RELEASE_CAPSULE | Freq: Every day | ORAL | 1 refills | Status: DC

## 2016-03-05 MED ORDER — GUAIFENESIN 100 MG/5ML PO LIQD: 100.0000 mg | ORAL | 0 refills | Status: DC | PRN

## 2016-03-05 NOTE — Discharge Instructions (Signed)
Heartburn During Pregnancy Heartburn is a burning sensation in the chest caused by stomach acid backing up into the esophagus. Heartburn is common in pregnancy because a certain hormone (progesterone) is released when a woman is pregnant. The progesterone hormone may relax the valve that separates the esophagus from the stomach. This allows acid to go up into the esophagus, causing heartburn. Heartburn may also happen in pregnancy because the enlarging uterus pushes up on the stomach, which pushes more acid into the esophagus. This is especially true in the later stages of pregnancy. Heartburn problems usually go away after giving birth. CAUSES  Heartburn is caused by stomach acid backing up into the esophagus. During pregnancy, this may result from various things, including:   The progesterone hormone.  Changing hormone levels.  The growing uterus pushing stomach acid upward.  Large meals.  Certain foods and drinks.  Exercise.  Increased acid production. SIGNS AND SYMPTOMS   Burning pain in the chest or lower throat.  Bitter taste in the mouth.  Coughing. DIAGNOSIS  Your health care provider will typically diagnose heartburn by taking a careful history of your concern. Blood tests may be done to check for a certain type of bacteria that is associated with heartburn. Sometimes, heartburn is diagnosed by prescribing a heartburn medicine to see if the symptoms improve. In some cases, a procedure called an endoscopy may be done. In this procedure, a tube with a light and a camera on the end (endoscope) is used to examine the esophagus and the stomach. TREATMENT  Treatment will vary depending on the severity of your symptoms. Your health care provider may recommend:  Over-the-counter medicines (antacids, acid reducers) for mild heartburn.  Prescription medicines to decrease stomach acid or to protect your stomach lining.  Certain changes in your diet.  Elevating the head of your bed  by putting blocks under the legs. This helps prevent stomach acid from backing up into the esophagus when you are lying down. HOME CARE INSTRUCTIONS   Only take over-the-counter or prescription medicines as directed by your health care provider.  Raise the head of your bed by putting blocks under the legs if instructed to do so by your health care provider. Sleeping with more pillows is not effective because it only changes the position of your head.  Do not exercise right after eating.  Avoid eating 2-3 hours before bed. Do not lie down right after eating.  Eat small meals throughout the day instead of three large meals.  Identify foods and beverages that make your symptoms worse and avoid them. Foods you may want to avoid include:  Peppers.  Chocolate.  High-fat foods, including fried foods.  Spicy foods.  Garlic and onions.  Citrus fruits, including oranges, grapefruit, lemons, and limes.  Food containing tomatoes or tomato products.  Mint.  Carbonated and caffeinated drinks.  Vinegar. SEEK MEDICAL CARE IF:  You have abdominal pain of any kind.  You feel burning in your upper abdomen or chest, especially after eating or lying down.  You have nausea and vomiting.  Your stomach feels upset after you eat. SEEK IMMEDIATE MEDICAL CARE IF:   You have severe chest pain that goes down your arm or into your jaw or neck.  You feel sweaty, dizzy, or light-headed.  You become short of breath.  You vomit blood.  You have difficulty or pain with swallowing.  You have bloody or black, tarry stools.  You have episodes of heartburn more than 3 times a week,  for more than 2 weeks. MAKE SURE YOU:  Understand these instructions.  Will watch your condition.  Will get help right away if you are not doing well or get worse.   This information is not intended to replace advice given to you by your health care provider. Make sure you discuss any questions you have with  your health care provider.   Document Released: 06/22/2000 Document Revised: 07/16/2014 Document Reviewed: 02/11/2013 Elsevier Interactive Patient Education 2016 Elsevier Inc. Upper Respiratory Infection, Adult Most upper respiratory infections (URIs) are a viral infection of the air passages leading to the lungs. A URI affects the nose, throat, and upper air passages. The most common type of URI is nasopharyngitis and is typically referred to as "the common cold." URIs run their course and usually go away on their own. Most of the time, a URI does not require medical attention, but sometimes a bacterial infection in the upper airways can follow a viral infection. This is called a secondary infection. Sinus and middle ear infections are common types of secondary upper respiratory infections. Bacterial pneumonia can also complicate a URI. A URI can worsen asthma and chronic obstructive pulmonary disease (COPD). Sometimes, these complications can require emergency medical care and may be life threatening.  CAUSES Almost all URIs are caused by viruses. A virus is a type of germ and can spread from one person to another.  RISKS FACTORS You may be at risk for a URI if:   You smoke.   You have chronic heart or lung disease.  You have a weakened defense (immune) system.   You are very young or very old.   You have nasal allergies or asthma.  You work in crowded or poorly ventilated areas.  You work in health care facilities or schools. SIGNS AND SYMPTOMS  Symptoms typically develop 2-3 days after you come in contact with a cold virus. Most viral URIs last 7-10 days. However, viral URIs from the influenza virus (flu virus) can last 14-18 days and are typically more severe. Symptoms may include:   Runny or stuffy (congested) nose.   Sneezing.   Cough.   Sore throat.   Headache.   Fatigue.   Fever.   Loss of appetite.   Pain in your forehead, behind your eyes, and over  your cheekbones (sinus pain).  Muscle aches.  DIAGNOSIS  Your health care provider may diagnose a URI by:  Physical exam.  Tests to check that your symptoms are not due to another condition such as:  Strep throat.  Sinusitis.  Pneumonia.  Asthma. TREATMENT  A URI goes away on its own with time. It cannot be cured with medicines, but medicines may be prescribed or recommended to relieve symptoms. Medicines may help:  Reduce your fever.  Reduce your cough.  Relieve nasal congestion. HOME CARE INSTRUCTIONS   Take medicines only as directed by your health care provider.   Gargle warm saltwater or take cough drops to comfort your throat as directed by your health care provider.  Use a warm mist humidifier or inhale steam from a shower to increase air moisture. This may make it easier to breathe.  Drink enough fluid to keep your urine clear or pale yellow.   Eat soups and other clear broths and maintain good nutrition.   Rest as needed.   Return to work when your temperature has returned to normal or as your health care provider advises. You may need to stay home longer to avoid infecting  others. You can also use a face mask and careful hand washing to prevent spread of the virus.  Increase the usage of your inhaler if you have asthma.   Do not use any tobacco products, including cigarettes, chewing tobacco, or electronic cigarettes. If you need help quitting, ask your health care provider. PREVENTION  The best way to protect yourself from getting a cold is to practice good hygiene.   Avoid oral or hand contact with people with cold symptoms.   Wash your hands often if contact occurs.  There is no clear evidence that vitamin C, vitamin E, echinacea, or exercise reduces the chance of developing a cold. However, it is always recommended to get plenty of rest, exercise, and practice good nutrition.  SEEK MEDICAL CARE IF:   You are getting worse rather than better.    Your symptoms are not controlled by medicine.   You have chills.  You have worsening shortness of breath.  You have brown or red mucus.  You have yellow or brown nasal discharge.  You have pain in your face, especially when you bend forward.  You have a fever.  You have swollen neck glands.  You have pain while swallowing.  You have white areas in the back of your throat. SEEK IMMEDIATE MEDICAL CARE IF:   You have severe or persistent:  Headache.  Ear pain.  Sinus pain.  Chest pain.  You have chronic lung disease and any of the following:  Wheezing.  Prolonged cough.  Coughing up blood.  A change in your usual mucus.  You have a stiff neck.  You have changes in your:  Vision.  Hearing.  Thinking.  Mood. MAKE SURE YOU:   Understand these instructions.  Will watch your condition.  Will get help right away if you are not doing well or get worse.   This information is not intended to replace advice given to you by your health care provider. Make sure you discuss any questions you have with your health care provider.   Document Released: 12/19/2000 Document Revised: 11/09/2014 Document Reviewed: 09/30/2013 Elsevier Interactive Patient Education 2016 Elsevier Inc. Round Ligament Pain The round ligament is a cord of muscle and tissue that helps to support the uterus. It can become a source of pain during pregnancy if it becomes stretched or twisted as the baby grows. The pain usually begins in the second trimester of pregnancy, and it can come and go until the baby is delivered. It is not a serious problem, and it does not cause harm to the baby. Round ligament pain is usually a short, sharp, and pinching pain, but it can also be a dull, lingering, and aching pain. The pain is felt in the lower side of the abdomen or in the groin. It usually starts deep in the groin and moves up to the outside of the hip area. Pain can occur with:  A sudden change  in position.  Rolling over in bed.  Coughing or sneezing.  Physical activity. HOME CARE INSTRUCTIONS Watch your condition for any changes. Take these steps to help with your pain:  When the pain starts, relax. Then try:  Sitting down.  Flexing your knees up to your abdomen.  Lying on your side with one pillow under your abdomen and another pillow between your legs.  Sitting in a warm bath for 15-20 minutes or until the pain goes away.  Take over-the-counter and prescription medicines only as told by your health care provider.  Move slowly when you sit and stand.  Avoid long walks if they cause pain.  Stop or lessen your physical activities if they cause pain. SEEK MEDICAL CARE IF:  Your pain does not go away with treatment.  You feel pain in your back that you did not have before.  Your medicine is not helping. SEEK IMMEDIATE MEDICAL CARE IF:  You develop a fever or chills.  You develop uterine contractions.  You develop vaginal bleeding.  You develop nausea or vomiting.  You develop diarrhea.  You have pain when you urinate.   This information is not intended to replace advice given to you by your health care provider. Make sure you discuss any questions you have with your health care provider.   Document Released: 04/03/2008 Document Revised: 09/17/2011 Document Reviewed: 09/01/2014 Elsevier Interactive Patient Education [employer]

## 2016-03-05 NOTE — MAU Provider Note (Signed)
History     CSN: [id number]  Arrival date and time: 03/05/16 1226   First Provider Initiated Contact with Patient 03/05/16 1514      Chief Complaint  Patient presents with  . Cough  . Abdominal Pain   Burmese interpreter per [employer] [name] is a 35 y.o. [id number] at [redacted]w[redacted]d by sure LMP presenting with generalized intermittent lower abdominal pain. The pain is worse when sitting and more noticeable when she is lying down to sleep. Also worse when she tries to cough up mucus. She is having epigastric pain and heartburn.  Denies pelvic pain or vaginal bleeding. She has a 38-month old.    Abdominal Pain  This is a recurrent problem. The current episode started in the past 7 days. The onset quality is undetermined. The problem occurs intermittently. The problem has been waxing and waning. The pain is located in the RLQ, LLQ and periumbilical region. The pain is at a severity of 3/10. The pain is mild. The quality of the pain is aching. The abdominal pain does not radiate. Associated symptoms include frequency and headaches. Pertinent negatives include no anorexia, constipation, diarrhea, dysuria, hematuria, myalgias, nausea or vomiting. The pain is aggravated by being still and certain positions. The pain is relieved by nothing.     OB History  Gravida Para Term Preterm AB Living  3 2 1 1  0 2  SAB TAB Ectopic Multiple Live Births  0 0 0 0 2    # Outcome Date GA Lbr Len/2nd Weight Sex Delivery Anes PTL Lv  3 Current           2 Term 05/20/15 [redacted]w[redacted]d 09:53 / 01:50 3.275 kg (7 lb 3.5 oz) F Vag-Spont EPI, Local  LIV     Birth Comments: left knee hyperextended  1 Preterm 09/06/11 [redacted]w[redacted]d 21:35 / 02:15 2.977 kg (6 lb 9 oz) M Vag-Spont EPI  LIV     Complications: Preterm premature rupture of membranes (PPROM) delivered, current hospitalization      Past Medical History:  Diagnosis Date  . Headache(784.0)   . 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      Past Surgical History:  Procedure Laterality Date  . NO PAST SURGERIES      Family History  Problem Relation Age of Onset  . Anesthesia problems Neg Hx     Social History  Substance Use Topics  . Smoking status: Never Smoker  . Smokeless tobacco: Never Used  . Alcohol use No    Allergies: No Known Allergies  Prescriptions Prior to Admission  Medication Sig Dispense Refill Last Dose  . OVER THE COUNTER MEDICATION Folic acid     . ibuprofen (ADVIL,MOTRIN) 600 MG tablet Take 1 tablet (600 mg total) by mouth every 6 (six) hours as needed. 30 tablet 1   . prenatal vitamin w/FE, FA (PRENATAL 1 + 1) 27-1 MG TABS tablet Take 1 tablet by mouth daily. 30 each 0     Review of Systems  Respiratory: Positive for cough.        Dry cough, feels like mucus won't come up. Epigastric and substernal burning  Cardiovascular: Negative for chest pain.  Gastrointestinal: Positive for abdominal pain. Negative for anorexia, constipation, diarrhea, nausea and vomiting.  Genitourinary: Positive for frequency. Negative for dysuria and hematuria.  Musculoskeletal: Negative for myalgias.  Neurological: Positive for headaches.   Physical Exam   Blood pressure 105/73, pulse 77, temperature 98.4  F (36.9 C), temperature source Oral, resp. rate 18, last menstrual period 11/26/2015, SpO2 99 %, unknown if currently breastfeeding.  Physical Exam  Nursing note and vitals reviewed. Constitutional: She is oriented to person, place, and time. She appears well-developed and well-nourished. No distress.  HENT:  Head: Normocephalic.  Cardiovascular: Normal rate and regular rhythm.   Respiratory: Effort normal and breath sounds normal. No respiratory distress.  GI: Soft. There is no tenderness. There is no rebound and no guarding.  DT FHR 160 S=D  Genitourinary:  Genitourinary Comments: SVE: long, closed  Musculoskeletal: Normal range of motion.  Neurological: She is alert and oriented to person,  place, and time.  Skin: Skin is warm and dry.  Psychiatric: She has a normal mood and affect.    MAU Course  Procedures Results for orders placed or performed during the hospital encounter of 03/05/16 (from the past 24 hour(s))  Pregnancy, urine POC     Status: Abnormal   Collection Time: 03/05/16 12:56 PM  Result Value Ref Range   Preg Test, Ur POSITIVE (A) NEGATIVE  Urinalysis, Routine w reflex microscopic ([facility])     Status: Abnormal   Collection Time: 03/05/16  3:00 PM  Result Value Ref Range   Color, Urine YELLOW YELLOW   APPearance CLEAR CLEAR   Specific Gravity, Urine 1.010 1.005 - 1.030   pH 7.0 5.0 - 8.0   Glucose, UA NEGATIVE NEGATIVE mg/dL   Hgb urine dipstick NEGATIVE NEGATIVE   Bilirubin Urine NEGATIVE NEGATIVE   Ketones, ur 15 (A) NEGATIVE mg/dL   Protein, ur NEGATIVE NEGATIVE mg/dL   Nitrite NEGATIVE NEGATIVE   Leukocytes, UA NEGATIVE NEGATIVE     Assessment and Plan   1. Round ligament pain   2. Short interval between pregnancies affecting pregnancy in second trimester, antepartum   3. Insufficient prenatal care in second trimester   4. History of preterm delivery, currently pregnant in second trimester   5. URI (upper respiratory infection)      Medication List    STOP taking these medications   ibuprofen 600 MG tablet Commonly known as:  ADVIL,MOTRIN   OVER THE COUNTER MEDICATION     TAKE these medications   guaiFENesin 100 MG/5ML liquid Commonly known as:  ROBITUSSIN Take 5-10 mLs (100-200 mg total) by mouth every 4 (four) hours as needed for cough.   omeprazole 20 MG capsule Commonly known as:  PRILOSEC Take 1 capsule (20 mg total) by mouth daily.   prenatal vitamin w/FE, FA 27-1 MG Tabs tablet Take 1 tablet by mouth daily.      Follow-up Information    [facility]   Specialty:  Obstetrics and Gynecology Contact information: [address] [place] [id number] [phone]           Alishia Lebo 03/05/2016, 3:53 PM

## 2016-03-05 NOTE — MAU Note (Signed)
Urine in lab 

## 2016-03-05 NOTE — MAU Note (Signed)
Has pain in RUQ, not constant.  Has a productive cough, lots of mucous

## 2016-03-29 ENCOUNTER — Ambulatory Visit (INDEPENDENT_AMBULATORY_CARE_PROVIDER_SITE_OTHER): Payer: Medicaid Other | Admitting: Obstetrics & Gynecology

## 2016-03-29 VITALS — BP 110/74 | HR 86 | Wt 172.0 lb

## 2016-03-29 DIAGNOSIS — Z3689 Encounter for other specified antenatal screening: Secondary | ICD-10-CM

## 2016-03-29 DIAGNOSIS — O09212 Supervision of pregnancy with history of pre-term labor, second trimester: Secondary | ICD-10-CM | POA: Diagnosis present

## 2016-03-29 DIAGNOSIS — O09892 Supervision of other high risk pregnancies, second trimester: Secondary | ICD-10-CM

## 2016-03-29 DIAGNOSIS — Z113 Encounter for screening for infections with a predominantly sexual mode of transmission: Secondary | ICD-10-CM | POA: Diagnosis not present

## 2016-03-29 DIAGNOSIS — O0990 Supervision of high risk pregnancy, unspecified, unspecified trimester: Secondary | ICD-10-CM | POA: Insufficient documentation

## 2016-03-29 DIAGNOSIS — O0992 Supervision of high risk pregnancy, unspecified, second trimester: Secondary | ICD-10-CM

## 2016-03-29 DIAGNOSIS — O09219 Supervision of pregnancy with history of pre-term labor, unspecified trimester: Secondary | ICD-10-CM

## 2016-03-29 DIAGNOSIS — O09899 Supervision of other high risk pregnancies, unspecified trimester: Secondary | ICD-10-CM | POA: Insufficient documentation

## 2016-03-29 NOTE — Progress Notes (Signed)
Pt is declining flu vaccine and 17 P at this time.

## 2016-03-29 NOTE — Progress Notes (Signed)
Subjective:   [name] is a 35 y.o. [id number] 5171w5d by certain LMP being seen today for her first obstetrical visit.  Her obstetrical history is significant for history of preterm delivery. Patient does intend to breast feed. Pregnancy history fully reviewed.  Patient reports no complaints.  HISTORY: Obstetric History   G3   P2   T1   P1   A0   L2    SAB0   TAB0   Ectopic0   Multiple0   Live Births2     # Outcome Date GA Lbr Len/2nd Weight Sex Delivery Anes PTL Lv  3 Current           2 Term 05/20/15 2967w1d 09:53 / 01:50 7 lb 3.5 oz (3.275 kg) F Vag-Spont EPI, Local  LIV     Name: [name]     Apgar1:  8                Apgar5: 9  1 Preterm 09/06/11 5140w1d 21:35 / 02:15 6 lb 9 oz (2.977 kg) M Vag-Spont EPI  LIV     Name: [name]     Complications: Preterm premature rupture of membranes (PPROM) delivered, current hospitalization     Apgar1:  8                Apgar5: 8     Past Medical History:  Diagnosis Date  . Headache(784.0)   . 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    Past Surgical History:  Procedure Laterality Date  . NO PAST SURGERIES     Family History  Problem Relation Age of Onset  . Anesthesia problems Neg Hx    Social History  Substance Use Topics  . Smoking status: Never Smoker  . Smokeless tobacco: Never Used  . Alcohol use No   No Known Allergies  Meds: Prenatal Vitamins  Exam   Vitals:   03/29/16 1435  BP: 110/74  Pulse: 86  Weight: 172 lb (78 kg)    Fetal Heart Rate (bpm): 160  Uterus:     Pelvic Exam:    Perineum: No Hemorrhoids, Normal Perineum   Vulva: normal   Vagina:  normal mucosa, normal discharge   Cervix: no lesions and normal   Adnexa: normal adnexa and no mass, fullness, tenderness   Bony Pelvis: average  System: Breast:  normal appearance, no masses or tenderness   Skin: normal coloration and turgor, no rashes   Neurologic: oriented, normal, negative, normal mood   Extremities: normal strength, tone, and muscle mass, ROM of all joints is normal   HEENT PERRLA, extra ocular movement intact and sclera clear, anicteric   Mouth/Teeth mucous membranes moist, pharynx normal without lesions and dental hygiene good   Neck supple and no masses   Cardiovascular: regular rate and rhythm   Respiratory:  appears well, vitals normal, no respiratory distress, acyanotic, normal RR   Abdomen: soft, non-tender; bowel sounds normal; no masses,  no organomegaly     Assessment:   Pregnancy: [id number] Patient Active Problem List   Diagnosis Date Noted  . History of preterm delivery, currently pregnant 03/29/2016  . Supervision of high risk pregnancy, antepartum 03/29/2016  . Language barrier, speaks Burnese only 02/10/2015        Plan:  1. History of preterm delivery, currently pregnant, second trimester She declines 17P.  2. Encounter for fetal anatomic survey Anatomy scan ordered - US MFM OB COMP +  14 WK; Future  3. Supervision of high risk pregnancy, antepartum, second trimester - Prenatal Profile - Culture, OB Urine - Pain Mgmt, Profile 6 Conf w/o mM, U - GC/Chlamydia probe amp (George)not at [facility] [place] MFM OB COMP + 14 WK; Future - Hemoglobinopathy Evaluation - AFP, Quad Screen   Initial labs drawn. Continue prenatal vitamins. Problem list reviewed and updated. The nature of  - [facility] with multiple MDs and other Advanced Practice Providers was explained to patient; also emphasized that residents, students are part of our team. Routine obstetric precautions reviewed. Follow up in 4 weeks.    Jaynie Collins, MD, FACOG Attending Obstetrician & Gynecologist, [facility] for [employer], [facility]

## 2016-03-29 NOTE — Patient Instructions (Signed)
Thank you for enrolling in MyChart. Please follow the instructions below to securely access your online medical record. MyChart allows you to send messages to your doctor, view your test results, manage appointments, and more.   How Do I Sign Up? 1. In your Internet browser, go to [employer] and enter [email or web address]. 2. Click on the Sign Up Now link in the Sign In box. You will see the New Member Sign Up page. 3. Enter your MyChart Access Code exactly as it appears below. You will not need to use this code after you've completed the sign-up process. If you do not sign up before the expiration date, you must request a new code.  MyChart Access Code: [email or web address] Expires: 05/04/2016  3:27 PM  4. Enter your Social Security Number ([id number]) and Date of Birth (mm/dd/yyyy) as indicated and click Submit. You will be taken to the next sign-up page. 5. Create a MyChart ID. This will be your MyChart login ID and cannot be changed, so think of one that is secure and easy to remember. 6. Create a MyChart password. You can change your password at any time. 7. Enter your Password Reset Question and Answer. This can be used at a later time if you forget your password. h 8. Enter your e-mail address. You will receive e-mail notification when new information is available in MyChart. 9. Click Sign Up. You can now view your medical record.   Additional Information Remember, MyChart is NOT to be used for urgent needs. For medical emergencies, dial 911.

## 2016-03-30 LAB — PRENATAL PROFILE (SOLSTAS)
Antibody Screen: NEGATIVE
BASOS ABS: 0 {cells}/uL (ref 0–200)
Basophils Relative: 0 %
EOS PCT: 1 %
Eosinophils Absolute: 94 cells/uL (ref 15–500)
HEMATOCRIT: 34.2 % — AB (ref 35.0–45.0)
HEMOGLOBIN: 10.9 g/dL — AB (ref 11.7–15.5)
HIV 1&2 Ab, 4th Generation: NONREACTIVE
Hepatitis B Surface Ag: NEGATIVE
Lymphocytes Relative: 20 %
Lymphs Abs: 1880 cells/uL (ref 850–3900)
MCH: 25.8 pg — AB (ref 27.0–33.0)
MCHC: 31.9 g/dL — AB (ref 32.0–36.0)
MCV: 81 fL (ref 80.0–100.0)
MONOS PCT: 5 %
MPV: 10.3 fL (ref 7.5–12.5)
Monocytes Absolute: 470 cells/uL (ref 200–950)
NEUTROS ABS: 6956 {cells}/uL (ref 1500–7800)
Neutrophils Relative %: 74 %
Platelets: 281 10*3/uL (ref 140–400)
RBC: 4.22 MIL/uL (ref 3.80–5.10)
RDW: 15.3 % — AB (ref 11.0–15.0)
RUBELLA: 2.04 {index} — AB (ref <0.90)
Rh Type: POSITIVE
WBC: 9.4 10*3/uL (ref 3.8–10.8)

## 2016-03-30 LAB — PAIN MGMT, PROFILE 6 CONF W/O MM, U
6 ACETYLMORPHINE: NEGATIVE ng/mL (ref <10)
ALCOHOL METABOLITES: NEGATIVE ng/mL (ref <500)
Amphetamines: NEGATIVE ng/mL (ref <500)
BARBITURATES: NEGATIVE ng/mL (ref <300)
Benzodiazepines: NEGATIVE ng/mL (ref <100)
COCAINE METABOLITE: NEGATIVE ng/mL (ref <150)
Creatinine: 141 mg/dL (ref >=20.0)
METHADONE METABOLITE: NEGATIVE ng/mL (ref <100)
Marijuana Metabolite: NEGATIVE ng/mL (ref <20)
OXYCODONE: NEGATIVE ng/mL (ref <100)
Opiates: NEGATIVE ng/mL (ref <100)
Oxidant: NEGATIVE ug/mL (ref <200)
PHENCYCLIDINE: NEGATIVE ng/mL (ref <25)
Please note:: 0
pH: 6.52 (ref 4.5–9.0)

## 2016-03-30 LAB — AFP, QUAD SCREEN
AFP: 45 ng/mL
CURR GEST AGE: 17.7 wk
Down Syndrome Scr Risk Est: 1:31700 {titer}
HCG TOTAL: 10.85 [IU]/mL
INH: 140 pg/mL
INTERPRETATION-AFP: NEGATIVE
MOM FOR HCG: 0.33
MoM for AFP: 1.06
MoM for INH: 0.85
Open Spina bifida: NEGATIVE
TRI 18 SCR RISK EST: NEGATIVE
UE3 MOM: 1.06
UE3 VALUE: 1.4 ng/mL

## 2016-03-30 LAB — CULTURE, OB URINE

## 2016-03-30 LAB — GC/CHLAMYDIA PROBE AMP (~~LOC~~) NOT AT ARMC
Chlamydia: NEGATIVE
NEISSERIA GONORRHEA: NEGATIVE

## 2016-04-02 LAB — HEMOGLOBINOPATHY EVALUATION
HEMATOCRIT: 34.2 % — AB (ref 35.0–45.0)
HEMOGLOBIN: 10.9 g/dL — AB (ref 11.7–15.5)
HGB A: 96.6 % (ref >96.0)
Hgb A2 Quant: 2.4 % (ref 1.8–3.5)
Hgb F Quant: <1 % (ref <2.0)
MCH: 25.8 pg — ABNORMAL LOW (ref 27.0–33.0)
MCV: 81 fL (ref 80.0–100.0)
RBC: 4.22 MIL/uL (ref 3.80–5.10)
RDW: 15.3 % — ABNORMAL HIGH (ref 11.0–15.0)

## 2016-04-12 ENCOUNTER — Other Ambulatory Visit: Payer: Self-pay | Admitting: Obstetrics & Gynecology

## 2016-04-12 ENCOUNTER — Encounter (HOSPITAL_COMMUNITY): Payer: Self-pay

## 2016-04-12 ENCOUNTER — Ambulatory Visit (HOSPITAL_COMMUNITY)
Admission: RE | Admit: 2016-04-12 | Discharge: 2016-04-12 | Disposition: A | Discharge status: Home / Self Care | Payer: Medicaid Other | Source: Ambulatory Visit | Attending: Obstetrics & Gynecology | Admitting: Obstetrics & Gynecology

## 2016-04-12 DIAGNOSIS — Z363 Encounter for antenatal screening for malformations: Secondary | ICD-10-CM | POA: Diagnosis present

## 2016-04-12 DIAGNOSIS — O09219 Supervision of pregnancy with history of pre-term labor, unspecified trimester: Secondary | ICD-10-CM

## 2016-04-12 DIAGNOSIS — O09899 Supervision of other high risk pregnancies, unspecified trimester: Secondary | ICD-10-CM

## 2016-04-12 DIAGNOSIS — O09212 Supervision of pregnancy with history of pre-term labor, second trimester: Secondary | ICD-10-CM | POA: Insufficient documentation

## 2016-04-12 DIAGNOSIS — O09892 Supervision of other high risk pregnancies, second trimester: Secondary | ICD-10-CM

## 2016-04-12 DIAGNOSIS — O0992 Supervision of high risk pregnancy, unspecified, second trimester: Secondary | ICD-10-CM

## 2016-04-12 DIAGNOSIS — Z3A19 19 weeks gestation of pregnancy: Secondary | ICD-10-CM | POA: Diagnosis not present

## 2016-04-12 DIAGNOSIS — Z3689 Encounter for other specified antenatal screening: Secondary | ICD-10-CM

## 2016-04-12 DIAGNOSIS — O0990 Supervision of high risk pregnancy, unspecified, unspecified trimester: Secondary | ICD-10-CM

## 2016-04-16 ENCOUNTER — Other Ambulatory Visit (HOSPITAL_COMMUNITY): Payer: Self-pay | Admitting: *Deleted

## 2016-04-16 DIAGNOSIS — O09219 Supervision of pregnancy with history of pre-term labor, unspecified trimester: Secondary | ICD-10-CM

## 2016-04-26 ENCOUNTER — Encounter: Payer: Self-pay | Admitting: Obstetrics and Gynecology

## 2016-04-26 ENCOUNTER — Ambulatory Visit (INDEPENDENT_AMBULATORY_CARE_PROVIDER_SITE_OTHER): Payer: Medicaid Other | Admitting: Family Medicine

## 2016-04-26 VITALS — BP 99/66 | HR 68 | Temp 98.2°F | Wt 176.4 lb

## 2016-04-26 DIAGNOSIS — O0990 Supervision of high risk pregnancy, unspecified, unspecified trimester: Secondary | ICD-10-CM

## 2016-04-26 DIAGNOSIS — O09899 Supervision of other high risk pregnancies, unspecified trimester: Secondary | ICD-10-CM

## 2016-04-26 DIAGNOSIS — R0981 Nasal congestion: Secondary | ICD-10-CM

## 2016-04-26 DIAGNOSIS — Z789 Other specified health status: Secondary | ICD-10-CM

## 2016-04-26 DIAGNOSIS — Z603 Acculturation difficulty: Secondary | ICD-10-CM

## 2016-04-26 DIAGNOSIS — O09212 Supervision of pregnancy with history of pre-term labor, second trimester: Secondary | ICD-10-CM

## 2016-04-26 DIAGNOSIS — O09219 Supervision of pregnancy with history of pre-term labor, unspecified trimester: Principal | ICD-10-CM

## 2016-04-26 LAB — POCT URINALYSIS DIP (DEVICE)
Bilirubin Urine: NEGATIVE
Glucose, UA: NEGATIVE mg/dL
HGB URINE DIPSTICK: NEGATIVE
LEUKOCYTES UA: NEGATIVE
Nitrite: NEGATIVE
PH: 7 (ref 5.0–8.0)
Protein, ur: NEGATIVE mg/dL
SPECIFIC GRAVITY, URINE: 1.02 (ref 1.005–1.030)
UROBILINOGEN UA: 0.2 mg/dL (ref 0.0–1.0)

## 2016-04-26 MED ORDER — PRENATAL PLUS 27-1 MG PO TABS: 1.0000 | ORAL_TABLET | Freq: Every day | ORAL | 6 refills | Status: DC

## 2016-04-26 MED ORDER — GUAIFENESIN ER 600 MG PO TB12: 600.0000 mg | ORAL_TABLET | Freq: Two times a day (BID) | ORAL | Status: DC

## 2016-04-26 NOTE — Progress Notes (Signed)
C/o clear-yellow sputum with blood coughed up yesterday. Burmese video interpreter "Naw" [id number] used

## 2016-04-26 NOTE — Progress Notes (Signed)
   PRENATAL VISIT NOTE  Subjective:  [name] is a 35 y.o. [id number] at 8216w5d being seen today for ongoing prenatal care.  She is currently monitored for the following issues for this high-risk pregnancy and has History of preterm delivery, currently pregnant; Language barrier, speaks Burnese only; and Supervision of high risk pregnancy, antepartum on her problem list.  Patient reports mucous in throat, nose bleed, small amount of blood in sputum.  Contractions: Not present. Vag. Bleeding: None.   . Denies leaking of fluid.   The following portions of the patient's history were reviewed and updated as appropriate: allergies, current medications, past family history, past medical history, past social history, past surgical history and problem list. Problem list updated.  Objective:   Vitals:   04/26/16 1429  BP: 99/66  Pulse: 68  Temp: 98.2 F (36.8 C)  Weight: 176 lb 6.4 oz (80 kg)    Fetal Status: Fetal Heart Rate (bpm): 156 Fundal Height: 20 cm       General:  Alert, oriented and cooperative. Patient is in no acute distress.  Skin: Skin is warm and dry. No rash noted.   Cardiovascular: Normal heart rate noted  Respiratory: Normal respiratory effort, no problems with respiration noted  Abdomen: Soft, gravid, appropriate for gestational age. Pain/Pressure: Absent     Pelvic:  Cervical exam deferred        Extremities: Normal range of motion.  Edema: None  Mental Status: Normal mood and affect. Normal behavior. Normal judgment and thought content.   Assessment and Plan:  Pregnancy: G3P1102 at 10116w5d  1. History of preterm delivery, currently pregnant Declines 17 OHP  2. Language barrier, speaks Burnese only Interpreter used  3. Supervision of high risk pregnancy, antepartum - prenatal vitamin w/FE, FA (PRENATAL 1 + 1) 27-1 MG TABS tablet; Take 1 tablet by mouth daily.  Dispense: 30 each; Refill: 6 Has f/u u/s to complete anatomy scheduled.  4. Congested nose Doubt  TB related, has h/o positive PPD, but underwent treatment 6 years ago and no weight loss, night sweats, etc. - guaiFENesin (MUCINEX) 12 hr tablet 600 mg; Take 1 tablet (600 mg total) by mouth 2 (two) times daily.  General obstetric precautions including but not limited to vaginal bleeding, contractions, leaking of fluid and fetal movement were reviewed in detail with the patient. Please refer to After Visit Summary for other counseling recommendations.  [facility]2016[facility]  Sierra Boresanya S Tania Steinhauser, MD

## 2016-04-26 NOTE — Progress Notes (Signed)
Burmese interpreter [phone]#[id number] Naw used

## 2016-04-26 NOTE — Patient Instructions (Signed)
Second Trimester of Pregnancy The second trimester is from week 13 through week 28, months 4 through 6. The second trimester is often a time when you feel your best. Your body has also adjusted to being pregnant, and you begin to feel better physically. Usually, morning sickness has lessened or quit completely, you may have more energy, and you may have an increase in appetite. The second trimester is also a time when the fetus is growing rapidly. At the end of the sixth month, the fetus is about 9 inches long and weighs about 1 pounds. You will likely begin to feel the baby move (quickening) between 18 and 20 weeks of the pregnancy. BODY CHANGES Your body goes through many changes during pregnancy. The changes vary from woman to woman.   Your weight will continue to increase. You will notice your lower abdomen bulging out.  You may begin to get stretch marks on your hips, abdomen, and breasts.  You may develop headaches that can be relieved by medicines approved by your health care provider.  You may urinate more often because the fetus is pressing on your bladder.  You may develop or continue to have heartburn as a result of your pregnancy.  You may develop constipation because certain hormones are causing the muscles that push waste through your intestines to slow down.  You may develop hemorrhoids or swollen, bulging veins (varicose veins).  You may have back pain because of the weight gain and pregnancy hormones relaxing your joints between the bones in your pelvis and as a result of a shift in weight and the muscles that support your balance.  Your breasts will continue to grow and be tender.  Your gums may bleed and may be sensitive to brushing and flossing.  Dark spots or blotches (chloasma, mask of pregnancy) may develop on your face. This will likely fade after the baby is born.  A dark line from your belly button to the pubic area (linea nigra) may appear. This will likely  fade after the baby is born.  You may have changes in your hair. These can include thickening of your hair, rapid growth, and changes in texture. Some women also have hair loss during or after pregnancy, or hair that feels dry or thin. Your hair will most likely return to normal after your baby is born. WHAT TO EXPECT AT YOUR PRENATAL VISITS During a routine prenatal visit:  You will be weighed to make sure you and the fetus are growing normally.  Your blood pressure will be taken.  Your abdomen will be measured to track your baby's growth.  The fetal heartbeat will be listened to.  Any test results from the previous visit will be discussed. Your health care provider may ask you:  How you are feeling.  If you are feeling the baby move.  If you have had any abnormal symptoms, such as leaking fluid, bleeding, severe headaches, or abdominal cramping.  If you are using any tobacco products, including cigarettes, chewing tobacco, and electronic cigarettes.  If you have any questions. Other tests that may be performed during your second trimester include:  Blood tests that check for:  Low iron levels (anemia).  Gestational diabetes (between 24 and 28 weeks).  Rh antibodies.  Urine tests to check for infections, diabetes, or protein in the urine.  An ultrasound to confirm the proper growth and development of the baby.  An amniocentesis to check for possible genetic problems.  Fetal screens for spina bifida   and Down syndrome.  HIV (human immunodeficiency virus) testing. Routine prenatal testing includes screening for HIV, unless you choose not to have this test. HOME CARE INSTRUCTIONS   Avoid all smoking, herbs, alcohol, and unprescribed drugs. These chemicals affect the formation and growth of the baby.  Do not use any tobacco products, including cigarettes, chewing tobacco, and electronic cigarettes. If you need help quitting, ask your health care provider. You may receive  counseling support and other resources to help you quit.  Follow your health care provider's instructions regarding medicine use. There are medicines that are either safe or unsafe to take during pregnancy.  Exercise only as directed by your health care provider. Experiencing uterine cramps is a good sign to stop exercising.  Continue to eat regular, healthy meals.  Wear a good support bra for breast tenderness.  Do not use hot tubs, steam rooms, or saunas.  Wear your seat belt at all times when driving.  Avoid raw meat, uncooked cheese, cat litter boxes, and soil used by cats. These carry germs that can cause birth defects in the baby.  Take your prenatal vitamins.  Take 1500-2000 mg of calcium daily starting at the 20th week of pregnancy until you deliver your baby.  Try taking a stool softener (if your health care provider approves) if you develop constipation. Eat more high-fiber foods, such as fresh vegetables or fruit and whole grains. Drink plenty of fluids to keep your urine clear or pale yellow.  Take warm sitz baths to soothe any pain or discomfort caused by hemorrhoids. Use hemorrhoid cream if your health care provider approves.  If you develop varicose veins, wear support hose. Elevate your feet for 15 minutes, 3-4 times a day. Limit salt in your diet.  Avoid heavy lifting, wear low heel shoes, and practice good posture.  Rest with your legs elevated if you have leg cramps or low back pain.  Visit your dentist if you have not gone yet during your pregnancy. Use a soft toothbrush to brush your teeth and be gentle when you floss.  A sexual relationship may be continued unless your health care provider directs you otherwise.  Continue to go to all your prenatal visits as directed by your health care provider. SEEK MEDICAL CARE IF:   You have dizziness.  You have mild pelvic cramps, pelvic pressure, or nagging pain in the abdominal area.  You have persistent nausea,  vomiting, or diarrhea.  You have a bad smelling vaginal discharge.  You have pain with urination. SEEK IMMEDIATE MEDICAL CARE IF:   You have a fever.  You are leaking fluid from your vagina.  You have spotting or bleeding from your vagina.  You have severe abdominal cramping or pain.  You have rapid weight gain or loss.  You have shortness of breath with chest pain.  You notice sudden or extreme swelling of your face, hands, ankles, feet, or legs.  You have not felt your baby move in over an hour.  You have severe headaches that do not go away with medicine.  You have vision changes.   This information is not intended to replace advice given to you by your health care provider. Make sure you discuss any questions you have with your health care provider.   Document Released: 06/19/2001 Document Revised: 07/16/2014 Document Reviewed: 08/26/2012 Elsevier Interactive Patient Education 2016 [employer] Inc[employer]   Breastfeeding Deciding to breastfeed is one of the best choices you can make for you and your baby. A change   in hormones during pregnancy causes your breast tissue to grow and increases the number and size of your milk ducts. These hormones also allow proteins, sugars, and fats from your blood supply to make breast milk in your milk-producing glands. Hormones prevent breast milk from being released before your baby is born as well as prompt milk flow after birth. Once breastfeeding has begun, thoughts of your baby, as well as his or her sucking or crying, can stimulate the release of milk from your milk-producing glands.  BENEFITS OF BREASTFEEDING For Your Baby  Your first milk (colostrum) helps your baby's digestive system function better.  There are antibodies in your milk that help your baby fight off infections.  Your baby has a lower incidence of asthma, allergies, and sudden infant death syndrome.  The nutrients in breast milk are better for your baby than infant  formulas and are designed uniquely for your baby's needs.  Breast milk improves your baby's brain development.  Your baby is less likely to develop other conditions, such as childhood obesity, asthma, or type 2 diabetes mellitus. For You  Breastfeeding helps to create a very special bond between you and your baby.  Breastfeeding is convenient. Breast milk is always available at the correct temperature and costs nothing.  Breastfeeding helps to burn calories and helps you lose the weight gained during pregnancy.  Breastfeeding makes your uterus contract to its prepregnancy size faster and slows bleeding (lochia) after you give birth.   Breastfeeding helps to lower your risk of developing type 2 diabetes mellitus, osteoporosis, and breast or ovarian cancer later in life. SIGNS THAT YOUR BABY IS HUNGRY Early Signs of Hunger  Increased alertness or activity.  Stretching.  Movement of the head from side to side.  Movement of the head and opening of the mouth when the corner of the mouth or cheek is stroked (rooting).  Increased sucking sounds, smacking lips, cooing, sighing, or squeaking.  Hand-to-mouth movements.  Increased sucking of fingers or hands. Late Signs of Hunger  Fussing.  Intermittent crying. Extreme Signs of Hunger Signs of extreme hunger will require calming and consoling before your baby will be able to breastfeed successfully. Do not wait for the following signs of extreme hunger to occur before you initiate breastfeeding:  Restlessness.  A loud, strong cry.  Screaming. BREASTFEEDING BASICS Breastfeeding Initiation  Find a comfortable place to sit or lie down, with your neck and back well supported.  Place a pillow or rolled up blanket under your baby to bring him or her to the level of your breast (if you are seated). Nursing pillows are specially designed to help support your arms and your baby while you breastfeed.  Make sure that your baby's  abdomen is facing your abdomen.  Gently massage your breast. With your fingertips, massage from your chest wall toward your nipple in a circular motion. This encourages milk flow. You may need to continue this action during the feeding if your milk flows slowly.  Support your breast with 4 fingers underneath and your thumb above your nipple. Make sure your fingers are well away from your nipple and your baby's mouth.  Stroke your baby's lips gently with your finger or nipple.  When your baby's mouth is open wide enough, quickly bring your baby to your breast, placing your entire nipple and as much of the colored area around your nipple (areola) as possible into your baby's mouth.  More areola should be visible above your baby's upper lip than   below the lower lip.  Your baby's tongue should be between his or her lower gum and your breast.  Ensure that your baby's mouth is correctly positioned around your nipple (latched). Your baby's lips should create a seal on your breast and be turned out (everted).  It is common for your baby to suck about 2-3 minutes in order to start the flow of breast milk. Latching Teaching your baby how to latch on to your breast properly is very important. An improper latch can cause nipple pain and decreased milk supply for you and poor weight gain in your baby. Also, if your baby is not latched onto your nipple properly, he or she may swallow some air during feeding. This can make your baby fussy. Burping your baby when you switch breasts during the feeding can help to get rid of the air. However, teaching your baby to latch on properly is still the best way to prevent fussiness from swallowing air while breastfeeding. Signs that your baby has successfully latched on to your nipple:  Silent tugging or silent sucking, without causing you pain.  Swallowing heard between every 3-4 sucks.  Muscle movement above and in front of his or her ears while sucking. Signs  that your baby has not successfully latched on to nipple:  Sucking sounds or smacking sounds from your baby while breastfeeding.  Nipple pain. If you think your baby has not latched on correctly, slip your finger into the corner of your baby's mouth to break the suction and place it between your baby's gums. Attempt breastfeeding initiation again. Signs of Successful Breastfeeding Signs from your baby:  A gradual decrease in the number of sucks or complete cessation of sucking.  Falling asleep.  Relaxation of his or her body.  Retention of a small amount of milk in his or her mouth.  Letting go of your breast by himself or herself. Signs from you:  Breasts that have increased in firmness, weight, and size 1-3 hours after feeding.  Breasts that are softer immediately after breastfeeding.  Increased milk volume, as well as a change in milk consistency and color by the fifth day of breastfeeding.  Nipples that are not sore, cracked, or bleeding. Signs That Your Baby is Getting Enough Milk  Wetting at least 3 diapers in a 24-hour period. The urine should be clear and pale yellow by age 5 days.  At least 3 stools in a 24-hour period by age 5 days. The stool should be soft and yellow.  At least 3 stools in a 24-hour period by age 7 days. The stool should be seedy and yellow.  No loss of weight greater than 10% of birth weight during the first 3 days of age.  Average weight gain of 4-7 ounces (113-198 g) per week after age 4 days.  Consistent daily weight gain by age 5 days, without weight loss after the age of 2 weeks. After a feeding, your baby may spit up a small amount. This is common. BREASTFEEDING FREQUENCY AND DURATION Frequent feeding will help you make more milk and can prevent sore nipples and breast engorgement. Breastfeed when you feel the need to reduce the fullness of your breasts or when your baby shows signs of hunger. This is called "breastfeeding on demand." Avoid  introducing a pacifier to your baby while you are working to establish breastfeeding (the first 4-6 weeks after your baby is born). After this time you may choose to use a pacifier. Research has shown that   pacifier use during the first year of a baby's life decreases the risk of sudden infant death syndrome (SIDS). Allow your baby to feed on each breast as long as he or she wants. Breastfeed until your baby is finished feeding. When your baby unlatches or falls asleep while feeding from the first breast, offer the second breast. Because newborns are often sleepy in the first few weeks of life, you may need to awaken your baby to get him or her to feed. Breastfeeding times will vary from baby to baby. However, the following rules can serve as a guide to help you ensure that your baby is properly fed:  Newborns (babies 4 weeks of age or younger) may breastfeed every 1-3 hours.  Newborns should not go longer than 3 hours during the day or 5 hours during the night without breastfeeding.  You should breastfeed your baby a minimum of 8 times in a 24-hour period until you begin to introduce solid foods to your baby at around 6 months of age. BREAST MILK PUMPING Pumping and storing breast milk allows you to ensure that your baby is exclusively fed your breast milk, even at times when you are unable to breastfeed. This is especially important if you are going back to work while you are still breastfeeding or when you are not able to be present during feedings. Your lactation consultant can give you guidelines on how long it is safe to store breast milk. A breast pump is a machine that allows you to pump milk from your breast into a sterile bottle. The pumped breast milk can then be stored in a refrigerator or freezer. Some breast pumps are operated by hand, while others use electricity. Ask your lactation consultant which type will work best for you. Breast pumps can be purchased, but some hospitals and  breastfeeding support groups lease breast pumps on a monthly basis. A lactation consultant can teach you how to hand express breast milk, if you prefer not to use a pump. CARING FOR YOUR BREASTS WHILE YOU BREASTFEED Nipples can become dry, cracked, and sore while breastfeeding. The following recommendations can help keep your breasts moisturized and healthy:  Avoid using soap on your nipples.  Wear a supportive bra. Although not required, special nursing bras and tank tops are designed to allow access to your breasts for breastfeeding without taking off your entire bra or top. Avoid wearing underwire-style bras or extremely tight bras.  Air dry your nipples for 3-4minutes after each feeding.  Use only cotton bra pads to absorb leaked breast milk. Leaking of breast milk between feedings is normal.  Use lanolin on your nipples after breastfeeding. Lanolin helps to maintain your skin's normal moisture barrier. If you use pure lanolin, you do not need to wash it off before feeding your baby again. Pure lanolin is not toxic to your baby. You may also hand express a few drops of breast milk and gently massage that milk into your nipples and allow the milk to air dry. In the first few weeks after giving birth, some women experience extremely full breasts (engorgement). Engorgement can make your breasts feel heavy, warm, and tender to the touch. Engorgement peaks within 3-5 days after you give birth. The following recommendations can help ease engorgement:  Completely empty your breasts while breastfeeding or pumping. You may want to start by applying warm, moist heat (in the shower or with warm water-soaked hand towels) just before feeding or pumping. This increases circulation and helps the milk   flow. If your baby does not completely empty your breasts while breastfeeding, pump any extra milk after he or she is finished.  Wear a snug bra (nursing or regular) or tank top for 1-2 days to signal your body  to slightly decrease milk production.  Apply ice packs to your breasts, unless this is too uncomfortable for you.  Make sure that your baby is latched on and positioned properly while breastfeeding. If engorgement persists after 48 hours of following these recommendations, contact your health care provider or a lactation consultant. OVERALL HEALTH CARE RECOMMENDATIONS WHILE BREASTFEEDING  Eat healthy foods. Alternate between meals and snacks, eating 3 of each per day. Because what you eat affects your breast milk, some of the foods may make your baby more irritable than usual. Avoid eating these foods if you are sure that they are negatively affecting your baby.  Drink milk, fruit juice, and water to satisfy your thirst (about 10 glasses a day).  Rest often, relax, and continue to take your prenatal vitamins to prevent fatigue, stress, and anemia.  Continue breast self-awareness checks.  Avoid chewing and smoking tobacco. Chemicals from cigarettes that pass into breast milk and exposure to secondhand smoke may harm your baby.  Avoid alcohol and drug use, including marijuana. Some medicines that may be harmful to your baby can pass through breast milk. It is important to ask your health care provider before taking any medicine, including all over-the-counter and prescription medicine as well as vitamin and herbal supplements. It is possible to become pregnant while breastfeeding. If birth control is desired, ask your health care provider about options that will be safe for your baby. SEEK MEDICAL CARE IF:  You feel like you want to stop breastfeeding or have become frustrated with breastfeeding.  You have painful breasts or nipples.  Your nipples are cracked or bleeding.  Your breasts are red, tender, or warm.  You have a swollen area on either breast.  You have a fever or chills.  You have nausea or vomiting.  You have drainage other than breast milk from your nipples.  Your  breasts do not become full before feedings by the fifth day after you give birth.  You feel sad and depressed.  Your baby is too sleepy to eat well.  Your baby is having trouble sleeping.   Your baby is wetting less than 3 diapers in a 24-hour period.  Your baby has less than 3 stools in a 24-hour period.  Your baby's skin or the white part of his or her eyes becomes yellow.   Your baby is not gaining weight by 5 days of age. SEEK IMMEDIATE MEDICAL CARE IF:  Your baby is overly tired (lethargic) and does not want to wake up and feed.  Your baby develops an unexplained fever.   This information is not intended to replace advice given to you by your health care provider. Make sure you discuss any questions you have with your health care provider.   Document Released: 06/25/2005 Document Revised: 03/16/2015 Document Reviewed: 12/17/2012 Elsevier Interactive Patient Education 2016 [employer] Inc.  

## 2016-05-10 ENCOUNTER — Other Ambulatory Visit (HOSPITAL_COMMUNITY): Payer: Self-pay | Admitting: Obstetrics and Gynecology

## 2016-05-10 ENCOUNTER — Encounter (HOSPITAL_COMMUNITY): Payer: Self-pay

## 2016-05-10 ENCOUNTER — Ambulatory Visit (HOSPITAL_COMMUNITY)
Admission: RE | Admit: 2016-05-10 | Discharge: 2016-05-10 | Disposition: A | Discharge status: Home / Self Care | Payer: Medicaid Other | Source: Ambulatory Visit | Attending: Obstetrics & Gynecology | Admitting: Obstetrics & Gynecology

## 2016-05-10 DIAGNOSIS — O09219 Supervision of pregnancy with history of pre-term labor, unspecified trimester: Secondary | ICD-10-CM

## 2016-05-10 DIAGNOSIS — O0990 Supervision of high risk pregnancy, unspecified, unspecified trimester: Secondary | ICD-10-CM

## 2016-05-10 DIAGNOSIS — Z3A23 23 weeks gestation of pregnancy: Secondary | ICD-10-CM | POA: Insufficient documentation

## 2016-05-10 DIAGNOSIS — O09212 Supervision of pregnancy with history of pre-term labor, second trimester: Secondary | ICD-10-CM | POA: Insufficient documentation

## 2016-05-10 DIAGNOSIS — O09899 Supervision of other high risk pregnancies, unspecified trimester: Secondary | ICD-10-CM

## 2016-05-10 NOTE — Addendum Note (Signed)
Encounter addended by: Lenoard Adenlivia M Rokhaya Quinn, RT on: 05/10/2016  4:51 PM<BR>    Actions taken: Imaging Exam ended

## 2016-05-24 ENCOUNTER — Ambulatory Visit (INDEPENDENT_AMBULATORY_CARE_PROVIDER_SITE_OTHER): Payer: Medicaid Other | Admitting: Obstetrics and Gynecology

## 2016-05-24 ENCOUNTER — Encounter: Payer: Self-pay | Admitting: Obstetrics and Gynecology

## 2016-05-24 VITALS — BP 100/66 | HR 87 | Wt 177.9 lb

## 2016-05-24 DIAGNOSIS — O0990 Supervision of high risk pregnancy, unspecified, unspecified trimester: Secondary | ICD-10-CM

## 2016-05-24 DIAGNOSIS — O09212 Supervision of pregnancy with history of pre-term labor, second trimester: Secondary | ICD-10-CM

## 2016-05-24 DIAGNOSIS — O09219 Supervision of pregnancy with history of pre-term labor, unspecified trimester: Secondary | ICD-10-CM

## 2016-05-24 DIAGNOSIS — O09899 Supervision of other high risk pregnancies, unspecified trimester: Secondary | ICD-10-CM

## 2016-05-24 NOTE — Progress Notes (Signed)
   PRENATAL VISIT NOTE  Subjective:  [name] is a 35 y.o. [id number] at 2162w5d being seen today for ongoing prenatal care.  She is currently monitored for the following issues for this high-risk pregnancy and has History of preterm delivery, currently pregnant; Language barrier, speaks Burnese only; and Supervision of high risk pregnancy, antepartum on her problem list.  Patient reports no complaints.  Contractions: Not present. Vag. Bleeding: None.  Movement: Present. Denies leaking of fluid.   The following portions of the patient's history were reviewed and updated as appropriate: allergies, current medications, past family history, past medical history, past social history, past surgical history and problem list. Problem list updated.  Objective:   Vitals:   05/24/16 1256  BP: 100/66  Pulse: 87  Weight: 177 lb 14.4 oz (80.7 kg)    Fetal Status: Fetal Heart Rate (bpm): 160 Fundal Height: 26 cm Movement: Present     General:  Alert, oriented and cooperative. Patient is in no acute distress.  Skin: Skin is warm and dry. No rash noted.   Cardiovascular: Normal heart rate noted  Respiratory: Normal respiratory effort, no problems with respiration noted  Abdomen: Soft, gravid, appropriate for gestational age. Pain/Pressure: Absent     Pelvic:  Cervical exam deferred        Extremities: Normal range of motion.  Edema: Trace  Mental Status: Normal mood and affect. Normal behavior. Normal judgment and thought content.   Assessment and Plan:  Pregnancy: G3P1102 at 4062w5d  1. Supervision of high risk pregnancy, antepartum Patient is doing well without complaints Third trimester labs and 2 hr glucola next visit. Patient informed to come in fasting for next appointment  2. History of preterm delivery, currently pregnant Declined 17-P  Preterm labor symptoms and general obstetric precautions including but not limited to vaginal bleeding, contractions, leaking of fluid and fetal  movement were reviewed in detail with the patient. Please refer to After Visit Summary for other counseling recommendations.  Return in about 2 weeks (around 06/07/2016) for ROb and 2 hr glucola.   Catalina AntiguaPeggy Sweetie Giebler, MD

## 2016-05-24 NOTE — Progress Notes (Signed)
Burmese video interpreter "Naw" [id number] used

## 2016-06-15 ENCOUNTER — Ambulatory Visit (INDEPENDENT_AMBULATORY_CARE_PROVIDER_SITE_OTHER): Payer: Medicaid Other | Admitting: Family Medicine

## 2016-06-15 ENCOUNTER — Encounter: Payer: Medicaid Other | Admitting: Family Medicine

## 2016-06-15 VITALS — BP 112/70 | HR 72 | Wt 178.0 lb

## 2016-06-15 DIAGNOSIS — Z789 Other specified health status: Secondary | ICD-10-CM

## 2016-06-15 DIAGNOSIS — Z23 Encounter for immunization: Secondary | ICD-10-CM | POA: Diagnosis not present

## 2016-06-15 DIAGNOSIS — O09213 Supervision of pregnancy with history of pre-term labor, third trimester: Secondary | ICD-10-CM

## 2016-06-15 DIAGNOSIS — O09219 Supervision of pregnancy with history of pre-term labor, unspecified trimester: Secondary | ICD-10-CM

## 2016-06-15 DIAGNOSIS — Z603 Acculturation difficulty: Secondary | ICD-10-CM

## 2016-06-15 DIAGNOSIS — O0990 Supervision of high risk pregnancy, unspecified, unspecified trimester: Secondary | ICD-10-CM

## 2016-06-15 DIAGNOSIS — O0992 Supervision of high risk pregnancy, unspecified, second trimester: Secondary | ICD-10-CM

## 2016-06-15 DIAGNOSIS — O09899 Supervision of other high risk pregnancies, unspecified trimester: Secondary | ICD-10-CM

## 2016-06-15 LAB — CBC
HEMATOCRIT: 33.1 % — AB (ref 35.0–45.0)
Hemoglobin: 10.6 g/dL — ABNORMAL LOW (ref 11.7–15.5)
MCH: 26.7 pg — ABNORMAL LOW (ref 27.0–33.0)
MCHC: 32 g/dL (ref 32.0–36.0)
MCV: 83.4 fL (ref 80.0–100.0)
MPV: 10.3 fL (ref 7.5–12.5)
Platelets: 242 10*3/uL (ref 140–400)
RBC: 3.97 MIL/uL (ref 3.80–5.10)
RDW: 15.2 % — AB (ref 11.0–15.0)
WBC: 10.3 10*3/uL (ref 3.8–10.8)

## 2016-06-15 NOTE — Addendum Note (Signed)
Addended by: Cheree DittoGRAHAM, DEMETRICE A on: 06/15/2016 10:57 AM   Modules accepted: Orders

## 2016-06-15 NOTE — Progress Notes (Signed)
   PRENATAL VISIT NOTE  Subjective:  [name] is a 35 y.o. [id number] at 6162w6d being seen today for ongoing prenatal care.  She is currently monitored for the following issues for this high-risk pregnancy and has History of preterm delivery, currently pregnant; Language barrier, speaks Burnese only; and Supervision of high risk pregnancy, antepartum on her problem list.  Patient reports no contractions.   .  .   . Denies leaking of fluid.   The following portions of the patient's history were reviewed and updated as appropriate: allergies, current medications, past family history, past medical history, past social history, past surgical history and problem list. Problem list updated.  Objective:  There were no vitals filed for this visit.  Fetal Status:           General:  Alert, oriented and cooperative. Patient is in no acute distress.  Skin: Skin is warm and dry. No rash noted.   Cardiovascular: Normal heart rate noted  Respiratory: Normal respiratory effort, no problems with respiration noted  Abdomen: Soft, gravid, appropriate for gestational age.       Pelvic:  Cervical exam deferred        Extremities: Normal range of motion.     Mental Status: Normal mood and affect. Normal behavior. Normal judgment and thought content.   Assessment and Plan:  Pregnancy: G3P1102 at 1962w6d  1. Supervision of high risk pregnancy in second trimester FHT and FH normal. 28 weeks labs.  - 2Hr GTT w/ 1 Hr Carpenter 75 g - HIV antibody - CBC - Flu Vaccine QUAD 36+ mos IM (Fluarix, Quad PF)  3. History of preterm delivery, currently pregnant No contractions  4. Language barrier, speaks Burnese only Language line used.  Preterm labor symptoms and general obstetric precautions including but not limited to vaginal bleeding, contractions, leaking of fluid and fetal movement were reviewed in detail with the patient. Please refer to After Visit Summary for other counseling recommendations.    No Follow-up on file.   Levie HeritageJacob J Klaus Casteneda, DO

## 2016-06-16 LAB — RPR

## 2016-06-16 LAB — 2HR GTT W 1 HR, CARPENTER, 75 G
GLUCOSE, 1 HR, GEST: 151 mg/dL (ref <180)
GLUCOSE, 2 HR, GEST: 149 mg/dL (ref <153)
Glucose, Fasting, Gest: 68 mg/dL (ref 65–91)

## 2016-06-16 LAB — HIV ANTIBODY (ROUTINE TESTING W REFLEX): HIV: NONREACTIVE

## 2016-06-29 ENCOUNTER — Ambulatory Visit (INDEPENDENT_AMBULATORY_CARE_PROVIDER_SITE_OTHER): Payer: Medicaid Other | Admitting: Obstetrics & Gynecology

## 2016-06-29 VITALS — BP 94/74 | HR 93 | Wt 175.0 lb

## 2016-06-29 DIAGNOSIS — O09219 Supervision of pregnancy with history of pre-term labor, unspecified trimester: Secondary | ICD-10-CM

## 2016-06-29 DIAGNOSIS — O09213 Supervision of pregnancy with history of pre-term labor, third trimester: Secondary | ICD-10-CM

## 2016-06-29 DIAGNOSIS — O0990 Supervision of high risk pregnancy, unspecified, unspecified trimester: Secondary | ICD-10-CM

## 2016-06-29 DIAGNOSIS — O09899 Supervision of other high risk pregnancies, unspecified trimester: Secondary | ICD-10-CM

## 2016-06-29 NOTE — Patient Instructions (Signed)
Return to clinic for any scheduled appointments or obstetric concerns, or go to [facility] for evaluation  

## 2016-06-29 NOTE — Progress Notes (Signed)
   PRENATAL VISIT NOTE  Subjective:  [name] is a 35 y.o. [id number] at 2523w6d being seen today for ongoing prenatal care.  She is currently monitored for the following issues for this high-risk pregnancy and has History of preterm delivery, currently pregnant; Language barrier, speaks Burnese only; and Supervision of high risk pregnancy, antepartum on her problem list.  Patient reports no complaints.  Contractions: Not present. Vag. Bleeding: None.  Movement: Present. Denies leaking of fluid.   The following portions of the patient's history were reviewed and updated as appropriate: allergies, current medications, past family history, past medical history, past social history, past surgical history and problem list. Problem list updated.  Objective:   Vitals:   06/29/16 0932  BP: 94/74  Pulse: 93  Weight: 175 lb (79.4 kg)    Fetal Status: Fetal Heart Rate (bpm): 152 Fundal Height: 31 cm Movement: Present     General:  Alert, oriented and cooperative. Patient is in no acute distress.  Skin: Skin is warm and dry. No rash noted.   Cardiovascular: Normal heart rate noted  Respiratory: Normal respiratory effort, no problems with respiration noted  Abdomen: Soft, gravid, appropriate for gestational age. Pain/Pressure: Absent     Pelvic:  Cervical exam deferred        Extremities: Normal range of motion.  Edema: None  Mental Status: Normal mood and affect. Normal behavior. Normal judgment and thought content.   Assessment and Plan:  Pregnancy: G3P1102 at 7823w6d  1. History of preterm delivery, currently pregnant No signs/symptoms of PTL, precautions advised  2. Supervision of high risk pregnancy, antepartum Preterm labor symptoms and general obstetric precautions including but not limited to vaginal bleeding, contractions, leaking of fluid and fetal movement were reviewed in detail with the patient. Please refer to After Visit Summary for other counseling recommendations.    Return in about 2 weeks (around 07/13/2016) for OB Visit.   Tereso NewcomerUgonna A Anyanwu, MD

## 2016-07-09 NOTE — L&D Delivery Note (Signed)
Delivery Note At 6:55 AM a viable and healthy female was delivered via Vaginal, Spontaneous Delivery (Presentation: OA).  APGAR: 8, 9; weight 6 lb 14.8 oz (3140 g).  Placenta status: Spontaneous, intact .  Cord:  with the following complications: Cord avulsed when RN pulled baby further up on abdomen. Identified immediately and both ends of cord clamped. ~15-20 cc blood loss from avulsion  Cord pH: NA.  Anesthesia:  Local Episiotomy: None Lacerations: 2nd degree Suture Repair: 3.0 vicryl rapide Est. Blood Loss (mL): 350  Mom to postpartum.  Baby to Couplet care / Skin to Skin. Placenta to: BS Feeding: Breast Circ: NA Contraception: Nexplanon  CTO BP closely. Will hold order for cath UA for now 2/2 pt discomfort. Other Pre-E labs Nml. No Sx Pre-E.   Sierra KinsmanVirginia Eragon Velasquez 08/20/2016, 9:27 AM

## 2016-07-13 ENCOUNTER — Ambulatory Visit (INDEPENDENT_AMBULATORY_CARE_PROVIDER_SITE_OTHER): Payer: Medicaid Other | Admitting: Family Medicine

## 2016-07-13 VITALS — BP 97/71 | HR 94 | Wt 177.0 lb

## 2016-07-13 DIAGNOSIS — O09899 Supervision of other high risk pregnancies, unspecified trimester: Secondary | ICD-10-CM

## 2016-07-13 DIAGNOSIS — O09219 Supervision of pregnancy with history of pre-term labor, unspecified trimester: Principal | ICD-10-CM

## 2016-07-13 DIAGNOSIS — O0990 Supervision of high risk pregnancy, unspecified, unspecified trimester: Secondary | ICD-10-CM

## 2016-07-13 DIAGNOSIS — O09213 Supervision of pregnancy with history of pre-term labor, third trimester: Secondary | ICD-10-CM

## 2016-07-13 NOTE — Progress Notes (Signed)
Video Burmese interpreter: Tax inspectorHtar Htar used   PRENATAL VISIT NOTE  Subjective:  [name] is a 36 y.o. [id number] at 9537w6d being seen today for ongoing prenatal care.  She is currently monitored for the following issues for this high-risk pregnancy and has History of preterm delivery, currently pregnant; Language barrier, speaks Burnese only; and Supervision of high risk pregnancy, antepartum on her problem list.  Patient reports got nail in her foot, no redness or soreness.  Contractions: Not present. Vag. Bleeding: None.  Movement: Present. Denies leaking of fluid.   The following portions of the patient's history were reviewed and updated as appropriate: allergies, current medications, past family history, past medical history, past social history, past surgical history and problem list. Problem list updated.  Objective:   Vitals:   07/13/16 1040  BP: 97/71  Pulse: 94  Weight: 177 lb (80.3 kg)    Fetal Status: Fetal Heart Rate (bpm): 160 Fundal Height: 31 cm Movement: Present     General:  Alert, oriented and cooperative. Patient is in no acute distress.  Skin: Skin is warm and dry. No rash noted.   Cardiovascular: Normal heart rate noted  Respiratory: Normal respiratory effort, no problems with respiration noted  Abdomen: Soft, gravid, appropriate for gestational age. Pain/Pressure: Absent     Pelvic:  Cervical exam deferred        Extremities: Normal range of motion.  Edema: None  Mental Status: Normal mood and affect. Normal behavior. Normal judgment and thought content.   Assessment and Plan:  Pregnancy: G3P1102 at 8337w6d  1. History of preterm delivery, currently pregnant PTL precautions reviewed  2. Supervision of high risk pregnancy, antepartum Normal 28 wk labs  Preterm labor symptoms and general obstetric precautions including but not limited to vaginal bleeding, contractions, leaking of fluid and fetal movement were reviewed in detail with the patient. Please  refer to After Visit Summary for other counseling recommendations.  Return in 2 weeks (on 07/27/2016).   Reva Boresanya S Cadance Raus, MD

## 2016-07-13 NOTE — Patient Instructions (Signed)
Breastfeeding Deciding to breastfeed is one of the best choices you can make for you and your baby. A change in hormones during pregnancy causes your breast tissue to grow and increases the number and size of your milk ducts. These hormones also allow proteins, sugars, and fats from your blood supply to make breast milk in your milk-producing glands. Hormones prevent breast milk from being released before your baby is born as well as prompt milk flow after birth. Once breastfeeding has begun, thoughts of your baby, as well as his or her sucking or crying, can stimulate the release of milk from your milk-producing glands. Benefits of breastfeeding For Your Baby  Your first milk (colostrum) helps your baby's digestive system function better.  There are antibodies in your milk that help your baby fight off infections.  Your baby has a lower incidence of asthma, allergies, and sudden infant death syndrome.  The nutrients in breast milk are better for your baby than infant formulas and are designed uniquely for your baby's needs.  Breast milk improves your baby's brain development.  Your baby is less likely to develop other conditions, such as childhood obesity, asthma, or type 2 diabetes mellitus. For You  Breastfeeding helps to create a very special bond between you and your baby.  Breastfeeding is convenient. Breast milk is always available at the correct temperature and costs nothing.  Breastfeeding helps to burn calories and helps you lose the weight gained during pregnancy.  Breastfeeding makes your uterus contract to its prepregnancy size faster and slows bleeding (lochia) after you give birth.  Breastfeeding helps to lower your risk of developing type 2 diabetes mellitus, osteoporosis, and breast or ovarian cancer later in life. Signs that your baby is hungry Early Signs of Hunger  Increased alertness or activity.  Stretching.  Movement of the head from side to  side.  Movement of the head and opening of the mouth when the corner of the mouth or cheek is stroked (rooting).  Increased sucking sounds, smacking lips, cooing, sighing, or squeaking.  Hand-to-mouth movements.  Increased sucking of fingers or hands. Late Signs of Hunger  Fussing.  Intermittent crying. Extreme Signs of Hunger  Signs of extreme hunger will require calming and consoling before your baby will be able to breastfeed successfully. Do not wait for the following signs of extreme hunger to occur before you initiate breastfeeding:  Restlessness.  A loud, strong cry.  Screaming. Breastfeeding basics  Breastfeeding Initiation  Find a comfortable place to sit or lie down, with your neck and back well supported.  Place a pillow or rolled up blanket under your baby to bring him or her to the level of your breast (if you are seated). Nursing pillows are specially designed to help support your arms and your baby while you breastfeed.  Make sure that your baby's abdomen is facing your abdomen.  Gently massage your breast. With your fingertips, massage from your chest wall toward your nipple in a circular motion. This encourages milk flow. You may need to continue this action during the feeding if your milk flows slowly.  Support your breast with 4 fingers underneath and your thumb above your nipple. Make sure your fingers are well away from your nipple and your baby's mouth.  Stroke your baby's lips gently with your finger or nipple.  When your baby's mouth is open wide enough, quickly bring your baby to your breast, placing your entire nipple and as much of the colored area around your  nipple (areola) as possible into your baby's mouth.  More areola should be visible above your baby's upper lip than below the lower lip.  Your baby's tongue should be between his or her lower gum and your breast.  Ensure that your baby's mouth is correctly positioned around your nipple  (latched). Your baby's lips should create a seal on your breast and be turned out (everted).  It is common for your baby to suck about 2-3 minutes in order to start the flow of breast milk. Latching  Teaching your baby how to latch on to your breast properly is very important. An improper latch can cause nipple pain and decreased milk supply for you and poor weight gain in your baby. Also, if your baby is not latched onto your nipple properly, he or she may swallow some air during feeding. This can make your baby fussy. Burping your baby when you switch breasts during the feeding can help to get rid of the air. However, teaching your baby to latch on properly is still the best way to prevent fussiness from swallowing air while breastfeeding. Signs that your baby has successfully latched on to your nipple:  Silent tugging or silent sucking, without causing you pain.  Swallowing heard between every 3-4 sucks.  Muscle movement above and in front of his or her ears while sucking. Signs that your baby has not successfully latched on to nipple:  Sucking sounds or smacking sounds from your baby while breastfeeding.  Nipple pain. If you think your baby has not latched on correctly, slip your finger into the corner of your baby's mouth to break the suction and place it between your baby's gums. Attempt breastfeeding initiation again. Signs of Successful Breastfeeding  Signs from your baby:  A gradual decrease in the number of sucks or complete cessation of sucking.  Falling asleep.  Relaxation of his or her body.  Retention of a small amount of milk in his or her mouth.  Letting go of your breast by himself or herself. Signs from you:  Breasts that have increased in firmness, weight, and size 1-3 hours after feeding.  Breasts that are softer immediately after breastfeeding.  Increased milk volume, as well as a change in milk consistency and color by the fifth day of  breastfeeding.  Nipples that are not sore, cracked, or bleeding. Signs That Your Pecola Leisure is Getting Enough Milk  Wetting at least 1-2 diapers during the first 24 hours after birth.  Wetting at least 5-6 diapers every 24 hours for the first week after birth. The urine should be clear or pale yellow by 5 days after birth.  Wetting 6-8 diapers every 24 hours as your baby continues to grow and develop.  At least 3 stools in a 24-hour period by age 84 days. The stool should be soft and yellow.  At least 3 stools in a 24-hour period by age 19 days. The stool should be seedy and yellow.  No loss of weight greater than 10% of birth weight during the first 73 days of age.  Average weight gain of 4-7 ounces (113-198 g) per week after age 64 days.  Consistent daily weight gain by age 84 days, without weight loss after the age of 2 weeks. After a feeding, your baby may spit up a small amount. This is common. Breastfeeding frequency and duration Frequent feeding will help you make more milk and can prevent sore nipples and breast engorgement. Breastfeed when you feel the need to reduce  the fullness of your breasts or when your baby shows signs of hunger. This is called "breastfeeding on demand." Avoid introducing a pacifier to your baby while you are working to establish breastfeeding (the first 4-6 weeks after your baby is born). After this time you may choose to use a pacifier. Research has shown that pacifier use during the first year of a baby's life decreases the risk of sudden infant death syndrome (SIDS). Allow your baby to feed on each breast as long as he or she wants. Breastfeed until your baby is finished feeding. When your baby unlatches or falls asleep while feeding from the first breast, offer the second breast. Because newborns are often sleepy in the first few weeks of life, you may need to awaken your baby to get him or her to feed. Breastfeeding times will vary from baby to baby. However, the  following rules can serve as a guide to help you ensure that your baby is properly fed:  Newborns (babies 7 weeks of age or younger) may breastfeed every 1-3 hours.  Newborns should not go longer than 3 hours during the day or 5 hours during the night without breastfeeding.  You should breastfeed your baby a minimum of 8 times in a 24-hour period until you begin to introduce solid foods to your baby at around 66 months of age. Breast milk pumping Pumping and storing breast milk allows you to ensure that your baby is exclusively fed your breast milk, even at times when you are unable to breastfeed. This is especially important if you are going back to work while you are still breastfeeding or when you are not able to be present during feedings. Your lactation consultant can give you guidelines on how long it is safe to store breast milk. A breast pump is a machine that allows you to pump milk from your breast into a sterile bottle. The pumped breast milk can then be stored in a refrigerator or freezer. Some breast pumps are operated by hand, while others use electricity. Ask your lactation consultant which type will work best for you. Breast pumps can be purchased, but some hospitals and breastfeeding support groups lease breast pumps on a monthly basis. A lactation consultant can teach you how to hand express breast milk, if you prefer not to use a pump. Caring for your breasts while you breastfeed Nipples can become dry, cracked, and sore while breastfeeding. The following recommendations can help keep your breasts moisturized and healthy:  Avoid using soap on your nipples.  Wear a supportive bra. Although not required, special nursing bras and tank tops are designed to allow access to your breasts for breastfeeding without taking off your entire bra or top. Avoid wearing underwire-style bras or extremely tight bras.  Air dry your nipples for 3-23minutes after each feeding.  Use only cotton bra  pads to absorb leaked breast milk. Leaking of breast milk between feedings is normal.  Use lanolin on your nipples after breastfeeding. Lanolin helps to maintain your skin's normal moisture barrier. If you use pure lanolin, you do not need to wash it off before feeding your baby again. Pure lanolin is not toxic to your baby. You may also hand express a few drops of breast milk and gently massage that milk into your nipples and allow the milk to air dry. In the first few weeks after giving birth, some women experience extremely full breasts (engorgement). Engorgement can make your breasts feel heavy, warm, and tender to the  touch. Engorgement peaks within 3-5 days after you give birth. The following recommendations can help ease engorgement:  Completely empty your breasts while breastfeeding or pumping. You may want to start by applying warm, moist heat (in the shower or with warm water-soaked hand towels) just before feeding or pumping. This increases circulation and helps the milk flow. If your baby does not completely empty your breasts while breastfeeding, pump any extra milk after he or she is finished.  Wear a snug bra (nursing or regular) or tank top for 1-2 days to signal your body to slightly decrease milk production.  Apply ice packs to your breasts, unless this is too uncomfortable for you.  Make sure that your baby is latched on and positioned properly while breastfeeding. If engorgement persists after 48 hours of following these recommendations, contact your health care provider or a Advertising copywriter. Overall health care recommendations while breastfeeding  Eat healthy foods. Alternate between meals and snacks, eating 3 of each per day. Because what you eat affects your breast milk, some of the foods may make your baby more irritable than usual. Avoid eating these foods if you are sure that they are negatively affecting your baby.  Drink milk, fruit juice, and water to satisfy your  thirst (about 10 glasses a day).  Rest often, relax, and continue to take your prenatal vitamins to prevent fatigue, stress, and anemia.  Continue breast self-awareness checks.  Avoid chewing and smoking tobacco. Chemicals from cigarettes that pass into breast milk and exposure to secondhand smoke may harm your baby.  Avoid alcohol and drug use, including marijuana. Some medicines that may be harmful to your baby can pass through breast milk. It is important to ask your health care provider before taking any medicine, including all over-the-counter and prescription medicine as well as vitamin and herbal supplements. It is possible to become pregnant while breastfeeding. If birth control is desired, ask your health care provider about options that will be safe for your baby. Contact a health care provider if:  You feel like you want to stop breastfeeding or have become frustrated with breastfeeding.  You have painful breasts or nipples.  Your nipples are cracked or bleeding.  Your breasts are red, tender, or warm.  You have a swollen area on either breast.  You have a fever or chills.  You have nausea or vomiting.  You have drainage other than breast milk from your nipples.  Your breasts do not become full before feedings by the fifth day after you give birth.  You feel sad and depressed.  Your baby is too sleepy to eat well.  Your baby is having trouble sleeping.  Your baby is wetting less than 3 diapers in a 24-hour period.  Your baby has less than 3 stools in a 24-hour period.  Your baby's skin or the white part of his or her eyes becomes yellow.  Your baby is not gaining weight by 20 days of age. Get help right away if:  Your baby is overly tired (lethargic) and does not want to wake up and feed.  Your baby develops an unexplained fever. This information is not intended to replace advice given to you by your health care provider. Make sure you discuss any questions  you have with your health care provider. Document Released: 06/25/2005 Document Revised: 12/07/2015 Document Reviewed: 12/17/2012 Elsevier Interactive Patient Education  2017 [employer].   Preterm Labor and Birth Information The normal length of a pregnancy is 39-41 weeks.  Preterm labor is when labor starts before 37 completed weeks of pregnancy. What are the risk factors for preterm labor? Preterm labor is more likely to occur in women who:  Have certain infections during pregnancy such as a bladder infection, sexually transmitted infection, or infection inside the uterus (chorioamnionitis).  Have a shorter-than-normal cervix.  Have gone into preterm labor before.  Have had surgery on their cervix.  Are younger than age 35 or older than age 35.  Are African American.  Are pregnant with twins or multiple babies (multiple gestation).  Take street drugs or smoke while pregnant.  Do not gain enough weight while pregnant.  Became pregnant shortly after having been pregnant. What are the symptoms of preterm labor? Symptoms of preterm labor include:  Cramps similar to those that can happen during a menstrual period. The cramps may happen with diarrhea.  Pain in the abdomen or lower back.  Regular uterine contractions that may feel like tightening of the abdomen.  A feeling of increased pressure in the pelvis.  Increased watery or bloody mucus discharge from the vagina.  Water breaking (ruptured amniotic sac). Why is it important to recognize signs of preterm labor? It is important to recognize signs of preterm labor because babies who are born prematurely may not be fully developed. This can put them at an increased risk for:  Long-term (chronic) heart and lung problems.  Difficulty immediately after birth with regulating body systems, including blood sugar, body temperature, heart rate, and breathing rate.  Bleeding in the brain.  Cerebral palsy.  Learning  difficulties.  Death. These risks are highest for babies who are born before 34 weeks of pregnancy. How is preterm labor treated? Treatment depends on the length of your pregnancy, your condition, and the health of your baby. It may involve:  Having a stitch (suture) placed in your cervix to prevent your cervix from opening too early (cerclage).  Taking or being given medicines, such as:  Hormone medicines. These may be given early in pregnancy to help support the pregnancy.  Medicine to stop contractions.  Medicines to help mature the baby's lungs. These may be prescribed if the risk of delivery is high.  Medicines to prevent your baby from developing cerebral palsy. If the labor happens before 34 weeks of pregnancy, you may need to stay in the hospital. What should I do if I think I am in preterm labor? If you think that you are going into preterm labor, call your health care provider right away. How can I prevent preterm labor in future pregnancies? To increase your chance of having a full-term pregnancy:  Do not use any tobacco products, such as cigarettes, chewing tobacco, and e-cigarettes. If you need help quitting, ask your health care provider.  Do not use street drugs or medicines that have not been prescribed to you during your pregnancy.  Talk with your health care provider before taking any herbal supplements, even if you have been taking them regularly.  Make sure you gain a healthy amount of weight during your pregnancy.  Watch for infection. If you think that you might have an infection, get it checked right away.  Make sure to tell your health care provider if you have gone into preterm labor before. This information is not intended to replace advice given to you by your health care provider. Make sure you discuss any questions you have with your health care provider. Document Released: 09/15/2003 Document Revised: 12/06/2015 Document Reviewed: 11/16/2015 Elsevier  Interactive  Patient Education  2017 [employer]  

## 2016-07-27 ENCOUNTER — Ambulatory Visit (INDEPENDENT_AMBULATORY_CARE_PROVIDER_SITE_OTHER): Payer: Medicaid Other | Admitting: Family Medicine

## 2016-07-27 VITALS — BP 107/65 | HR 72 | Wt 175.0 lb

## 2016-07-27 DIAGNOSIS — Z603 Acculturation difficulty: Secondary | ICD-10-CM

## 2016-07-27 DIAGNOSIS — O09219 Supervision of pregnancy with history of pre-term labor, unspecified trimester: Secondary | ICD-10-CM

## 2016-07-27 DIAGNOSIS — Z789 Other specified health status: Secondary | ICD-10-CM

## 2016-07-27 DIAGNOSIS — O09899 Supervision of other high risk pregnancies, unspecified trimester: Secondary | ICD-10-CM

## 2016-07-27 DIAGNOSIS — O0990 Supervision of high risk pregnancy, unspecified, unspecified trimester: Secondary | ICD-10-CM

## 2016-07-27 DIAGNOSIS — O09213 Supervision of pregnancy with history of pre-term labor, third trimester: Secondary | ICD-10-CM

## 2016-07-27 NOTE — Progress Notes (Signed)
   PRENATAL VISIT NOTE  Subjective:  [name] is a 36 y.o. [id number] at 3917w6d being seen today for ongoing prenatal care.  She is currently monitored for the following issues for this high-risk pregnancy and has History of preterm delivery, currently pregnant; Language barrier, speaks Burnese only; and Supervision of high risk pregnancy, antepartum on her problem list.  Patient reports occasional contractions.  Contractions: Not present.  .  Movement: Present. Denies leaking of fluid.   The following portions of the patient's history were reviewed and updated as appropriate: allergies, current medications, past family history, past medical history, past social history, past surgical history and problem list. Problem list updated.  Objective:   Vitals:   07/27/16 0924  BP: 107/65  Pulse: 72  Weight: 175 lb (79.4 kg)    Fetal Status: Fetal Heart Rate (bpm): 161 Fundal Height: 34 cm Movement: Present     General:  Alert, oriented and cooperative. Patient is in no acute distress.  Skin: Skin is warm and dry. No rash noted.   Cardiovascular: Normal heart rate noted  Respiratory: Normal respiratory effort, no problems with respiration noted  Abdomen: Soft, gravid, appropriate for gestational age. Pain/Pressure: Absent     Pelvic:  Cervical exam deferred        Extremities: Normal range of motion.  Edema: None  Mental Status: Normal mood and affect. Normal behavior. Normal judgment and thought content.   Assessment and Plan:  Pregnancy: G3P1102 at 6917w6d  1. Supervision of high risk pregnancy, antepartum FHT and FH normal  2. History of preterm delivery, currently pregnant Occasional contractions  3. Language barrier, speaks Burnese only   Preterm labor symptoms and general obstetric precautions including but not limited to vaginal bleeding, contractions, leaking of fluid and fetal movement were reviewed in detail with the patient. Please refer to After Visit Summary for  other counseling recommendations.  Return in about 1 week (around 08/03/2016) for OB f/u.   Levie HeritageJacob J Wells Gerdeman, DO

## 2016-08-02 ENCOUNTER — Ambulatory Visit (INDEPENDENT_AMBULATORY_CARE_PROVIDER_SITE_OTHER): Payer: Medicaid Other | Admitting: Family Medicine

## 2016-08-02 ENCOUNTER — Other Ambulatory Visit (HOSPITAL_COMMUNITY)
Admission: RE | Admit: 2016-08-02 | Discharge: 2016-08-02 | Disposition: A | Discharge status: Home / Self Care | Payer: Medicaid Other | Source: Ambulatory Visit | Attending: Family Medicine | Admitting: Family Medicine

## 2016-08-02 VITALS — BP 111/86 | HR 74 | Wt 177.0 lb

## 2016-08-02 DIAGNOSIS — O09213 Supervision of pregnancy with history of pre-term labor, third trimester: Secondary | ICD-10-CM

## 2016-08-02 DIAGNOSIS — O09899 Supervision of other high risk pregnancies, unspecified trimester: Secondary | ICD-10-CM

## 2016-08-02 DIAGNOSIS — O09893 Supervision of other high risk pregnancies, third trimester: Secondary | ICD-10-CM

## 2016-08-02 DIAGNOSIS — Z113 Encounter for screening for infections with a predominantly sexual mode of transmission: Secondary | ICD-10-CM | POA: Insufficient documentation

## 2016-08-02 DIAGNOSIS — O0990 Supervision of high risk pregnancy, unspecified, unspecified trimester: Secondary | ICD-10-CM

## 2016-08-02 DIAGNOSIS — O09219 Supervision of pregnancy with history of pre-term labor, unspecified trimester: Secondary | ICD-10-CM

## 2016-08-02 LAB — OB RESULTS CONSOLE GBS: STREP GROUP B AG: NEGATIVE

## 2016-08-02 NOTE — Patient Instructions (Signed)
Third Trimester of Pregnancy The third trimester is from week 29 through week 40 (months 7 through 9). The third trimester is a time when the unborn baby (fetus) is growing rapidly. At the end of the ninth month, the fetus is about 20 inches in length and weighs 6-10 pounds. Body changes during your third trimester Your body goes through many changes during pregnancy. The changes vary from woman to woman. During the third trimester:  Your weight will continue to increase. You can expect to gain 25-35 pounds (11-16 kg) by the end of the pregnancy.  You may begin to get stretch marks on your hips, abdomen, and breasts.  You may urinate more often because the fetus is moving lower into your pelvis and pressing on your bladder.  You may develop or continue to have heartburn. This is caused by increased hormones that slow down muscles in the digestive tract.  You may develop or continue to have constipation because increased hormones slow digestion and cause the muscles that push waste through your intestines to relax.  You may develop hemorrhoids. These are swollen veins (varicose veins) in the rectum that can itch or be painful.  You may develop swollen, bulging veins (varicose veins) in your legs.  You may have increased body aches in the pelvis, back, or thighs. This is due to weight gain and increased hormones that are relaxing your joints.  You may have changes in your hair. These can include thickening of your hair, rapid growth, and changes in texture. Some women also have hair loss during or after pregnancy, or hair that feels dry or thin. Your hair will most likely return to normal after your baby is born.  Your breasts will continue to grow and they will continue to become tender. A yellow fluid (colostrum) may leak from your breasts. This is the first milk you are producing for your baby.  Your belly button may stick out.  You may notice more swelling in your hands, face, or  ankles.  You may have increased tingling or numbness in your hands, arms, and legs. The skin on your belly may also feel numb.  You may feel short of breath because of your expanding uterus.  You may have more problems sleeping. This can be caused by the size of your belly, increased need to urinate, and an increase in your body's metabolism.  You may notice the fetus "dropping," or moving lower in your abdomen.  You may have increased vaginal discharge.  Your cervix becomes thin and soft (effaced) near your due date. What to expect at prenatal visits You will have prenatal exams every 2 weeks until week 36. Then you will have weekly prenatal exams. During a routine prenatal visit:  You will be weighed to make sure you and the fetus are growing normally.  Your blood pressure will be taken.  Your abdomen will be measured to track your baby's growth.  The fetal heartbeat will be listened to.  Any test results from the previous visit will be discussed.  You may have a cervical check near your due date to see if you have effaced. At around 36 weeks, your health care provider will check your cervix. At the same time, your health care provider will also perform a test on the secretions of the vaginal tissue. This test is to determine if a type of bacteria, Group B streptococcus, is present. Your health care provider will explain this further. Your health care provider may ask you:    What your birth plan is.  How you are feeling.  If you are feeling the baby move.  If you have had any abnormal symptoms, such as leaking fluid, bleeding, severe headaches, or abdominal cramping.  If you are using any tobacco products, including cigarettes, chewing tobacco, and electronic cigarettes.  If you have any questions. Other tests or screenings that may be performed during your third trimester include:  Blood tests that check for low iron levels (anemia).  Fetal testing to check the health,  activity level, and growth of the fetus. Testing is done if you have certain medical conditions or if there are problems during the pregnancy.  Nonstress test (NST). This test checks the health of your baby to make sure there are no signs of problems, such as the baby not getting enough oxygen. During this test, a belt is placed around your belly. The baby is made to move, and its heart rate is monitored during movement. What is false labor? False labor is a condition in which you feel small, irregular tightenings of the muscles in the womb (contractions) that eventually go away. These are called Braxton Hicks contractions. Contractions may last for hours, days, or even weeks before true labor sets in. If contractions come at regular intervals, become more frequent, increase in intensity, or become painful, you should see your health care provider. What are the signs of labor?  Abdominal cramps.  Regular contractions that start at 10 minutes apart and become stronger and more frequent with time.  Contractions that start on the top of the uterus and spread down to the lower abdomen and back.  Increased pelvic pressure and dull back pain.  A watery or bloody mucus discharge that comes from the vagina.  Leaking of amniotic fluid. This is also known as your "water breaking." It could be a slow trickle or a gush. Let your doctor know if it has a color or strange odor. If you have any of these signs, call your health care provider right away, even if it is before your due date. Follow these instructions at home: Eating and drinking  Continue to eat regular, healthy meals.  Do not eat:  Raw meat or meat spreads.  Unpasteurized milk or cheese.  Unpasteurized juice.  Store-made salad.  Refrigerated smoked seafood.  Hot dogs or deli meat, unless they are piping hot.  More than 6 ounces of albacore tuna a week.  Shark, swordfish, king mackerel, or tile fish.  Store-made salads.  Raw  sprouts, such as mung bean or alfalfa sprouts.  Take prenatal vitamins as told by your health care provider.  Take 1000 mg of calcium daily as told by your health care provider.  If you develop constipation:  Take over-the-counter or prescription medicines.  Drink enough fluid to keep your urine clear or pale yellow.  Eat foods that are high in fiber, such as fresh fruits and vegetables, whole grains, and beans.  Limit foods that are high in fat and processed sugars, such as fried and sweet foods. Activity  Exercise only as directed by your health care provider. Healthy pregnant women should aim for 2 hours and 30 minutes of moderate exercise per week. If you experience any pain or discomfort while exercising, stop.  Avoid heavy lifting.  Do not exercise in extreme heat or humidity, or at high altitudes.  Wear low-heel, comfortable shoes.  Practice good posture.  Do not travel far distances unless it is absolutely necessary and only with the approval   of your health care provider.  Wear your seat belt at all times while in a car, on a bus, or on a plane.  Take frequent breaks and rest with your legs elevated if you have leg cramps or low back pain.  Do not use hot tubs, steam rooms, or saunas.  You may continue to have sex unless your health care provider tells you otherwise. Lifestyle  Do not use any products that contain nicotine or tobacco, such as cigarettes and e-cigarettes. If you need help quitting, ask your health care provider.  Do not drink alcohol.  Do not use any medicinal herbs or unprescribed drugs. These chemicals affect the formation and growth of the baby.  If you develop varicose veins:  Wear support pantyhose or compression stockings as told by your healthcare provider.  Elevate your feet for 15 minutes, 3-4 times a day.  Wear a supportive maternity bra to help with breast tenderness. General instructions  Take over-the-counter and prescription  medicines only as told by your health care provider. There are medicines that are either safe or unsafe to take during pregnancy.  Take warm sitz baths to soothe any pain or discomfort caused by hemorrhoids. Use hemorrhoid cream or witch hazel if your health care provider approves.  Avoid cat litter boxes and soil used by cats. These carry germs that can cause birth defects in the baby. If you have a cat, ask someone to clean the litter box for you.  To prepare for the arrival of your baby:  Take prenatal classes to understand, practice, and ask questions about the labor and delivery.  Make a trial run to the hospital.  Visit the hospital and tour the maternity area.  Arrange for maternity or paternity leave through employers.  Arrange for family and friends to take care of pets while you are in the hospital.  Purchase a rear-facing car seat and make sure you know how to install it in your car.  Pack your hospital bag.  Prepare the baby's nursery. Make sure to remove all pillows and stuffed animals from the baby's crib to prevent suffocation.  Visit your dentist if you have not gone during your pregnancy. Use a soft toothbrush to brush your teeth and be gentle when you floss.  Keep all prenatal follow-up visits as told by your health care provider. This is important. Contact a health care provider if:  You are unsure if you are in labor or if your water has broken.  You become dizzy.  You have mild pelvic cramps, pelvic pressure, or nagging pain in your abdominal area.  You have lower back pain.  You have persistent nausea, vomiting, or diarrhea.  You have an unusual or bad smelling vaginal discharge.  You have pain when you urinate. Get help right away if:  You have a fever.  You are leaking fluid from your vagina.  You have spotting or bleeding from your vagina.  You have severe abdominal pain or cramping.  You have rapid weight loss or weight gain.  You have  shortness of breath with chest pain.  You notice sudden or extreme swelling of your face, hands, ankles, feet, or legs.  Your baby makes fewer than 10 movements in 2 hours.  You have severe headaches that do not go away with medicine.  You have vision changes. Summary  The third trimester is from week 29 through week 40, months 7 through 9. The third trimester is a time when the unborn baby (fetus)   is growing rapidly.  During the third trimester, your discomfort may increase as you and your baby continue to gain weight. You may have abdominal, leg, and back pain, sleeping problems, and an increased need to urinate.  During the third trimester your breasts will keep growing and they will continue to become tender. A yellow fluid (colostrum) may leak from your breasts. This is the first milk you are producing for your baby.  False labor is a condition in which you feel small, irregular tightenings of the muscles in the womb (contractions) that eventually go away. These are called Braxton Hicks contractions. Contractions may last for hours, days, or even weeks before true labor sets in.  Signs of labor can include: abdominal cramps; regular contractions that start at 10 minutes apart and become stronger and more frequent with time; watery or bloody mucus discharge that comes from the vagina; increased pelvic pressure and dull back pain; and leaking of amniotic fluid. This information is not intended to replace advice given to you by your health care provider. Make sure you discuss any questions you have with your health care provider. Document Released: 06/19/2001 Document Revised: 12/01/2015 Document Reviewed: 08/26/2012 [employer] Interactive Patient Education  2017 [employer]   Breastfeeding Deciding to breastfeed is one of the best choices you can make for you and your baby. A change in hormones during pregnancy causes your breast tissue to grow and increases the number and size of your  milk ducts. These hormones also allow proteins, sugars, and fats from your blood supply to make breast milk in your milk-producing glands. Hormones prevent breast milk from being released before your baby is born as well as prompt milk flow after birth. Once breastfeeding has begun, thoughts of your baby, as well as his or her sucking or crying, can stimulate the release of milk from your milk-producing glands. Benefits of breastfeeding For Your Baby  Your first milk (colostrum) helps your baby's digestive system function better.  There are antibodies in your milk that help your baby fight off infections.  Your baby has a lower incidence of asthma, allergies, and sudden infant death syndrome.  The nutrients in breast milk are better for your baby than infant formulas and are designed uniquely for your baby's needs.  Breast milk improves your baby's brain development.  Your baby is less likely to develop other conditions, such as childhood obesity, asthma, or type 2 diabetes mellitus. For You  Breastfeeding helps to create a very special bond between you and your baby.  Breastfeeding is convenient. Breast milk is always available at the correct temperature and costs nothing.  Breastfeeding helps to burn calories and helps you lose the weight gained during pregnancy.  Breastfeeding makes your uterus contract to its prepregnancy size faster and slows bleeding (lochia) after you give birth.  Breastfeeding helps to lower your risk of developing type 2 diabetes mellitus, osteoporosis, and breast or ovarian cancer later in life. Signs that your baby is hungry Early Signs of Hunger  Increased alertness or activity.  Stretching.  Movement of the head from side to side.  Movement of the head and opening of the mouth when the corner of the mouth or cheek is stroked (rooting).  Increased sucking sounds, smacking lips, cooing, sighing, or squeaking.  Hand-to-mouth movements.  Increased  sucking of fingers or hands. Late Signs of Hunger  Fussing.  Intermittent crying. Extreme Signs of Hunger  Signs of extreme hunger will require calming and consoling before your baby will   be able to breastfeed successfully. Do not wait for the following signs of extreme hunger to occur before you initiate breastfeeding:  Restlessness.  A loud, strong cry.  Screaming. Breastfeeding basics  Breastfeeding Initiation  Find a comfortable place to sit or lie down, with your neck and back well supported.  Place a pillow or rolled up blanket under your baby to bring him or her to the level of your breast (if you are seated). Nursing pillows are specially designed to help support your arms and your baby while you breastfeed.  Make sure that your baby's abdomen is facing your abdomen.  Gently massage your breast. With your fingertips, massage from your chest wall toward your nipple in a circular motion. This encourages milk flow. You may need to continue this action during the feeding if your milk flows slowly.  Support your breast with 4 fingers underneath and your thumb above your nipple. Make sure your fingers are well away from your nipple and your baby's mouth.  Stroke your baby's lips gently with your finger or nipple.  When your baby's mouth is open wide enough, quickly bring your baby to your breast, placing your entire nipple and as much of the colored area around your nipple (areola) as possible into your baby's mouth.  More areola should be visible above your baby's upper lip than below the lower lip.  Your baby's tongue should be between his or her lower gum and your breast.  Ensure that your baby's mouth is correctly positioned around your nipple (latched). Your baby's lips should create a seal on your breast and be turned out (everted).  It is common for your baby to suck about 2-3 minutes in order to start the flow of breast milk. Latching  Teaching your baby how to latch  on to your breast properly is very important. An improper latch can cause nipple pain and decreased milk supply for you and poor weight gain in your baby. Also, if your baby is not latched onto your nipple properly, he or she may swallow some air during feeding. This can make your baby fussy. Burping your baby when you switch breasts during the feeding can help to get rid of the air. However, teaching your baby to latch on properly is still the best way to prevent fussiness from swallowing air while breastfeeding. Signs that your baby has successfully latched on to your nipple:  Silent tugging or silent sucking, without causing you pain.  Swallowing heard between every 3-4 sucks.  Muscle movement above and in front of his or her ears while sucking. Signs that your baby has not successfully latched on to nipple:  Sucking sounds or smacking sounds from your baby while breastfeeding.  Nipple pain. If you think your baby has not latched on correctly, slip your finger into the corner of your baby's mouth to break the suction and place it between your baby's gums. Attempt breastfeeding initiation again. Signs of Successful Breastfeeding  Signs from your baby:  A gradual decrease in the number of sucks or complete cessation of sucking.  Falling asleep.  Relaxation of his or her body.  Retention of a small amount of milk in his or her mouth.  Letting go of your breast by himself or herself. Signs from you:  Breasts that have increased in firmness, weight, and size 1-3 hours after feeding.  Breasts that are softer immediately after breastfeeding.  Increased milk volume, as well as a change in milk consistency and color by   the fifth day of breastfeeding.  Nipples that are not sore, cracked, or bleeding. Signs That Your Baby is Getting Enough Milk  Wetting at least 1-2 diapers during the first 24 hours after birth.  Wetting at least 5-6 diapers every 24 hours for the first week after  birth. The urine should be clear or pale yellow by 5 days after birth.  Wetting 6-8 diapers every 24 hours as your baby continues to grow and develop.  At least 3 stools in a 24-hour period by age 5 days. The stool should be soft and yellow.  At least 3 stools in a 24-hour period by age 7 days. The stool should be seedy and yellow.  No loss of weight greater than 10% of birth weight during the first 3 days of age.  Average weight gain of 4-7 ounces (113-198 g) per week after age 4 days.  Consistent daily weight gain by age 5 days, without weight loss after the age of 2 weeks. After a feeding, your baby may spit up a small amount. This is common. Breastfeeding frequency and duration Frequent feeding will help you make more milk and can prevent sore nipples and breast engorgement. Breastfeed when you feel the need to reduce the fullness of your breasts or when your baby shows signs of hunger. This is called "breastfeeding on demand." Avoid introducing a pacifier to your baby while you are working to establish breastfeeding (the first 4-6 weeks after your baby is born). After this time you may choose to use a pacifier. Research has shown that pacifier use during the first year of a baby's life decreases the risk of sudden infant death syndrome (SIDS). Allow your baby to feed on each breast as long as he or she wants. Breastfeed until your baby is finished feeding. When your baby unlatches or falls asleep while feeding from the first breast, offer the second breast. Because newborns are often sleepy in the first few weeks of life, you may need to awaken your baby to get him or her to feed. Breastfeeding times will vary from baby to baby. However, the following rules can serve as a guide to help you ensure that your baby is properly fed:  Newborns (babies 4 weeks of age or younger) may breastfeed every 1-3 hours.  Newborns should not go longer than 3 hours during the day or 5 hours during the night  without breastfeeding.  You should breastfeed your baby a minimum of 8 times in a 24-hour period until you begin to introduce solid foods to your baby at around 6 months of age. Breast milk pumping Pumping and storing breast milk allows you to ensure that your baby is exclusively fed your breast milk, even at times when you are unable to breastfeed. This is especially important if you are going back to work while you are still breastfeeding or when you are not able to be present during feedings. Your lactation consultant can give you guidelines on how long it is safe to store breast milk. A breast pump is a machine that allows you to pump milk from your breast into a sterile bottle. The pumped breast milk can then be stored in a refrigerator or freezer. Some breast pumps are operated by hand, while others use electricity. Ask your lactation consultant which type will work best for you. Breast pumps can be purchased, but some hospitals and breastfeeding support groups lease breast pumps on a monthly basis. A lactation consultant can teach you how to   hand express breast milk, if you prefer not to use a pump. Caring for your breasts while you breastfeed Nipples can become dry, cracked, and sore while breastfeeding. The following recommendations can help keep your breasts moisturized and healthy:  Avoid using soap on your nipples.  Wear a supportive bra. Although not required, special nursing bras and tank tops are designed to allow access to your breasts for breastfeeding without taking off your entire bra or top. Avoid wearing underwire-style bras or extremely tight bras.  Air dry your nipples for 3-4minutes after each feeding.  Use only cotton bra pads to absorb leaked breast milk. Leaking of breast milk between feedings is normal.  Use lanolin on your nipples after breastfeeding. Lanolin helps to maintain your skin's normal moisture barrier. If you use pure lanolin, you do not need to wash it off  before feeding your baby again. Pure lanolin is not toxic to your baby. You may also hand express a few drops of breast milk and gently massage that milk into your nipples and allow the milk to air dry. In the first few weeks after giving birth, some women experience extremely full breasts (engorgement). Engorgement can make your breasts feel heavy, warm, and tender to the touch. Engorgement peaks within 3-5 days after you give birth. The following recommendations can help ease engorgement:  Completely empty your breasts while breastfeeding or pumping. You may want to start by applying warm, moist heat (in the shower or with warm water-soaked hand towels) just before feeding or pumping. This increases circulation and helps the milk flow. If your baby does not completely empty your breasts while breastfeeding, pump any extra milk after he or she is finished.  Wear a snug bra (nursing or regular) or tank top for 1-2 days to signal your body to slightly decrease milk production.  Apply ice packs to your breasts, unless this is too uncomfortable for you.  Make sure that your baby is latched on and positioned properly while breastfeeding. If engorgement persists after 48 hours of following these recommendations, contact your health care provider or a lactation consultant. Overall health care recommendations while breastfeeding  Eat healthy foods. Alternate between meals and snacks, eating 3 of each per day. Because what you eat affects your breast milk, some of the foods may make your baby more irritable than usual. Avoid eating these foods if you are sure that they are negatively affecting your baby.  Drink milk, fruit juice, and water to satisfy your thirst (about 10 glasses a day).  Rest often, relax, and continue to take your prenatal vitamins to prevent fatigue, stress, and anemia.  Continue breast self-awareness checks.  Avoid chewing and smoking tobacco. Chemicals from cigarettes that pass  into breast milk and exposure to secondhand smoke may harm your baby.  Avoid alcohol and drug use, including marijuana. Some medicines that may be harmful to your baby can pass through breast milk. It is important to ask your health care provider before taking any medicine, including all over-the-counter and prescription medicine as well as vitamin and herbal supplements. It is possible to become pregnant while breastfeeding. If birth control is desired, ask your health care provider about options that will be safe for your baby. Contact a health care provider if:  You feel like you want to stop breastfeeding or have become frustrated with breastfeeding.  You have painful breasts or nipples.  Your nipples are cracked or bleeding.  Your breasts are red, tender, or warm.  You have   a swollen area on either breast.  You have a fever or chills.  You have nausea or vomiting.  You have drainage other than breast milk from your nipples.  Your breasts do not become full before feedings by the fifth day after you give birth.  You feel sad and depressed.  Your baby is too sleepy to eat well.  Your baby is having trouble sleeping.  Your baby is wetting less than 3 diapers in a 24-hour period.  Your baby has less than 3 stools in a 24-hour period.  Your baby's skin or the white part of his or her eyes becomes yellow.  Your baby is not gaining weight by 5 days of age. Get help right away if:  Your baby is overly tired (lethargic) and does not want to wake up and feed.  Your baby develops an unexplained fever. This information is not intended to replace advice given to you by your health care provider. Make sure you discuss any questions you have with your health care provider. Document Released: 06/25/2005 Document Revised: 12/07/2015 Document Reviewed: 12/17/2012 Elsevier Interactive Patient Education  2017 Elsevier Inc.  

## 2016-08-02 NOTE — Progress Notes (Signed)
   PRENATAL VISIT NOTE  Subjective:  Lessie Dingsang Yi Yi Win Cowher is a 36 y.o. [id number] at 3556w5d being seen today for ongoing prenatal care.  She is currently monitored for the following issues for this high-risk pregnancy and has History of preterm delivery, currently pregnant; Language barrier, speaks Burnese only; and Supervision of high risk pregnancy, antepartum on her problem list.  Patient reports no complaints.  Contractions: Not present. Vag. Bleeding: None.  Movement: Present. Denies leaking of fluid.   The following portions of the patient's history were reviewed and updated as appropriate: allergies, current medications, past family history, past medical history, past social history, past surgical history and problem list. Problem list updated.  Objective:   Vitals:   08/02/16 1526  BP: 111/86  Pulse: 74  Weight: 177 lb (80.3 kg)    Fetal Status: Fetal Heart Rate (bpm): 160 Fundal Height: 34 cm Movement: Present  Presentation: Vertex  General:  Alert, oriented and cooperative. Patient is in no acute distress.  Skin: Skin is warm and dry. No rash noted.   Cardiovascular: Normal heart rate noted  Respiratory: Normal respiratory effort, no problems with respiration noted  Abdomen: Soft, gravid, appropriate for gestational age. Pain/Pressure: Absent     Pelvic:  Cervical exam performed Dilation: 2 Effacement (%): 50 Station: -2  Extremities: Normal range of motion.  Edema: None  Mental Status: Normal mood and affect. Normal behavior. Normal judgment and thought content.   Assessment and Plan:  Pregnancy: G3P1102 at 6256w5d  1. History of preterm delivery, currently pregnant in third trimester Cultures today - Cervicovaginal ancillary only - Culture, beta strep (group b only)  2. History of preterm delivery, currently pregnant Declined 17 P  3. Supervision of high risk pregnancy, antepartum Continue prenatal care.   Preterm labor symptoms and general obstetric precautions  including but not limited to vaginal bleeding, contractions, leaking of fluid and fetal movement were reviewed in detail with the patient. Please refer to After Visit Summary for other counseling recommendations.  Return in 1 week (on 08/09/2016).   Reva Boresanya S Yaiden Yang, MD

## 2016-08-03 LAB — CERVICOVAGINAL ANCILLARY ONLY
CHLAMYDIA, DNA PROBE: NEGATIVE
NEISSERIA GONORRHEA: NEGATIVE

## 2016-08-04 LAB — CULTURE, BETA STREP (GROUP B ONLY)

## 2016-08-08 ENCOUNTER — Ambulatory Visit (INDEPENDENT_AMBULATORY_CARE_PROVIDER_SITE_OTHER): Payer: Medicaid Other | Admitting: Family Medicine

## 2016-08-08 VITALS — BP 111/83 | HR 83 | Wt 179.0 lb

## 2016-08-08 DIAGNOSIS — O09899 Supervision of other high risk pregnancies, unspecified trimester: Secondary | ICD-10-CM

## 2016-08-08 DIAGNOSIS — N898 Other specified noninflammatory disorders of vagina: Secondary | ICD-10-CM

## 2016-08-08 DIAGNOSIS — O0990 Supervision of high risk pregnancy, unspecified, unspecified trimester: Secondary | ICD-10-CM

## 2016-08-08 DIAGNOSIS — O09219 Supervision of pregnancy with history of pre-term labor, unspecified trimester: Secondary | ICD-10-CM

## 2016-08-08 DIAGNOSIS — O9989 Other specified diseases and conditions complicating pregnancy, childbirth and the puerperium: Secondary | ICD-10-CM

## 2016-08-08 DIAGNOSIS — O09213 Supervision of pregnancy with history of pre-term labor, third trimester: Secondary | ICD-10-CM

## 2016-08-08 MED ORDER — TERCONAZOLE 0.8 % VA CREA: 1.0000 | TOPICAL_CREAM | Freq: Every day | VAGINAL | 0 refills | Status: DC

## 2016-08-08 MED ORDER — PRENATAL PLUS 27-1 MG PO TABS: 1.0000 | ORAL_TABLET | Freq: Every day | ORAL | 6 refills | Status: DC

## 2016-08-08 NOTE — Patient Instructions (Signed)
Third Trimester of Pregnancy The third trimester is from week 29 through week 40 (months 7 through 9). The third trimester is a time when the unborn baby (fetus) is growing rapidly. At the end of the ninth month, the fetus is about 20 inches in length and weighs 6-10 pounds. Body changes during your third trimester Your body goes through many changes during pregnancy. The changes vary from woman to woman. During the third trimester:  Your weight will continue to increase. You can expect to gain 25-35 pounds (11-16 kg) by the end of the pregnancy.  You may begin to get stretch marks on your hips, abdomen, and breasts.  You may urinate more often because the fetus is moving lower into your pelvis and pressing on your bladder.  You may develop or continue to have heartburn. This is caused by increased hormones that slow down muscles in the digestive tract.  You may develop or continue to have constipation because increased hormones slow digestion and cause the muscles that push waste through your intestines to relax.  You may develop hemorrhoids. These are swollen veins (varicose veins) in the rectum that can itch or be painful.  You may develop swollen, bulging veins (varicose veins) in your legs.  You may have increased body aches in the pelvis, back, or thighs. This is due to weight gain and increased hormones that are relaxing your joints.  You may have changes in your hair. These can include thickening of your hair, rapid growth, and changes in texture. Some women also have hair loss during or after pregnancy, or hair that feels dry or thin. Your hair will most likely return to normal after your baby is born.  Your breasts will continue to grow and they will continue to become tender. A yellow fluid (colostrum) may leak from your breasts. This is the first milk you are producing for your baby.  Your belly button may stick out.  You may notice more swelling in your hands, face, or  ankles.  You may have increased tingling or numbness in your hands, arms, and legs. The skin on your belly may also feel numb.  You may feel short of breath because of your expanding uterus.  You may have more problems sleeping. This can be caused by the size of your belly, increased need to urinate, and an increase in your body's metabolism.  You may notice the fetus "dropping," or moving lower in your abdomen.  You may have increased vaginal discharge.  Your cervix becomes thin and soft (effaced) near your due date. What to expect at prenatal visits You will have prenatal exams every 2 weeks until week 36. Then you will have weekly prenatal exams. During a routine prenatal visit:  You will be weighed to make sure you and the fetus are growing normally.  Your blood pressure will be taken.  Your abdomen will be measured to track your baby's growth.  The fetal heartbeat will be listened to.  Any test results from the previous visit will be discussed.  You may have a cervical check near your due date to see if you have effaced. At around 36 weeks, your health care provider will check your cervix. At the same time, your health care provider will also perform a test on the secretions of the vaginal tissue. This test is to determine if a type of bacteria, Group B streptococcus, is present. Your health care provider will explain this further. Your health care provider may ask you:    What your birth plan is.  How you are feeling.  If you are feeling the baby move.  If you have had any abnormal symptoms, such as leaking fluid, bleeding, severe headaches, or abdominal cramping.  If you are using any tobacco products, including cigarettes, chewing tobacco, and electronic cigarettes.  If you have any questions. Other tests or screenings that may be performed during your third trimester include:  Blood tests that check for low iron levels (anemia).  Fetal testing to check the health,  activity level, and growth of the fetus. Testing is done if you have certain medical conditions or if there are problems during the pregnancy.  Nonstress test (NST). This test checks the health of your baby to make sure there are no signs of problems, such as the baby not getting enough oxygen. During this test, a belt is placed around your belly. The baby is made to move, and its heart rate is monitored during movement. What is false labor? False labor is a condition in which you feel small, irregular tightenings of the muscles in the womb (contractions) that eventually go away. These are called Braxton Hicks contractions. Contractions may last for hours, days, or even weeks before true labor sets in. If contractions come at regular intervals, become more frequent, increase in intensity, or become painful, you should see your health care provider. What are the signs of labor?  Abdominal cramps.  Regular contractions that start at 10 minutes apart and become stronger and more frequent with time.  Contractions that start on the top of the uterus and spread down to the lower abdomen and back.  Increased pelvic pressure and dull back pain.  A watery or bloody mucus discharge that comes from the vagina.  Leaking of amniotic fluid. This is also known as your "water breaking." It could be a slow trickle or a gush. Let your doctor know if it has a color or strange odor. If you have any of these signs, call your health care provider right away, even if it is before your due date. Follow these instructions at home: Eating and drinking  Continue to eat regular, healthy meals.  Do not eat:  Raw meat or meat spreads.  Unpasteurized milk or cheese.  Unpasteurized juice.  Store-made salad.  Refrigerated smoked seafood.  Hot dogs or deli meat, unless they are piping hot.  More than 6 ounces of albacore tuna a week.  Shark, swordfish, king mackerel, or tile fish.  Store-made salads.  Raw  sprouts, such as mung bean or alfalfa sprouts.  Take prenatal vitamins as told by your health care provider.  Take 1000 mg of calcium daily as told by your health care provider.  If you develop constipation:  Take over-the-counter or prescription medicines.  Drink enough fluid to keep your urine clear or pale yellow.  Eat foods that are high in fiber, such as fresh fruits and vegetables, whole grains, and beans.  Limit foods that are high in fat and processed sugars, such as fried and sweet foods. Activity  Exercise only as directed by your health care provider. Healthy pregnant women should aim for 2 hours and 30 minutes of moderate exercise per week. If you experience any pain or discomfort while exercising, stop.  Avoid heavy lifting.  Do not exercise in extreme heat or humidity, or at high altitudes.  Wear low-heel, comfortable shoes.  Practice good posture.  Do not travel far distances unless it is absolutely necessary and only with the approval   of your health care provider.  Wear your seat belt at all times while in a car, on a bus, or on a plane.  Take frequent breaks and rest with your legs elevated if you have leg cramps or low back pain.  Do not use hot tubs, steam rooms, or saunas.  You may continue to have sex unless your health care provider tells you otherwise. Lifestyle  Do not use any products that contain nicotine or tobacco, such as cigarettes and e-cigarettes. If you need help quitting, ask your health care provider.  Do not drink alcohol.  Do not use any medicinal herbs or unprescribed drugs. These chemicals affect the formation and growth of the baby.  If you develop varicose veins:  Wear support pantyhose or compression stockings as told by your healthcare provider.  Elevate your feet for 15 minutes, 3-4 times a day.  Wear a supportive maternity bra to help with breast tenderness. General instructions  Take over-the-counter and prescription  medicines only as told by your health care provider. There are medicines that are either safe or unsafe to take during pregnancy.  Take warm sitz baths to soothe any pain or discomfort caused by hemorrhoids. Use hemorrhoid cream or witch hazel if your health care provider approves.  Avoid cat litter boxes and soil used by cats. These carry germs that can cause birth defects in the baby. If you have a cat, ask someone to clean the litter box for you.  To prepare for the arrival of your baby:  Take prenatal classes to understand, practice, and ask questions about the labor and delivery.  Make a trial run to the hospital.  Visit the hospital and tour the maternity area.  Arrange for maternity or paternity leave through employers.  Arrange for family and friends to take care of pets while you are in the hospital.  Purchase a rear-facing car seat and make sure you know how to install it in your car.  Pack your hospital bag.  Prepare the baby's nursery. Make sure to remove all pillows and stuffed animals from the baby's crib to prevent suffocation.  Visit your dentist if you have not gone during your pregnancy. Use a soft toothbrush to brush your teeth and be gentle when you floss.  Keep all prenatal follow-up visits as told by your health care provider. This is important. Contact a health care provider if:  You are unsure if you are in labor or if your water has broken.  You become dizzy.  You have mild pelvic cramps, pelvic pressure, or nagging pain in your abdominal area.  You have lower back pain.  You have persistent nausea, vomiting, or diarrhea.  You have an unusual or bad smelling vaginal discharge.  You have pain when you urinate. Get help right away if:  You have a fever.  You are leaking fluid from your vagina.  You have spotting or bleeding from your vagina.  You have severe abdominal pain or cramping.  You have rapid weight loss or weight gain.  You have  shortness of breath with chest pain.  You notice sudden or extreme swelling of your face, hands, ankles, feet, or legs.  Your baby makes fewer than 10 movements in 2 hours.  You have severe headaches that do not go away with medicine.  You have vision changes. Summary  The third trimester is from week 29 through week 40, months 7 through 9. The third trimester is a time when the unborn baby (fetus)   is growing rapidly.  During the third trimester, your discomfort may increase as you and your baby continue to gain weight. You may have abdominal, leg, and back pain, sleeping problems, and an increased need to urinate.  During the third trimester your breasts will keep growing and they will continue to become tender. A yellow fluid (colostrum) may leak from your breasts. This is the first milk you are producing for your baby.  False labor is a condition in which you feel small, irregular tightenings of the muscles in the womb (contractions) that eventually go away. These are called Braxton Hicks contractions. Contractions may last for hours, days, or even weeks before true labor sets in.  Signs of labor can include: abdominal cramps; regular contractions that start at 10 minutes apart and become stronger and more frequent with time; watery or bloody mucus discharge that comes from the vagina; increased pelvic pressure and dull back pain; and leaking of amniotic fluid. This information is not intended to replace advice given to you by your health care provider. Make sure you discuss any questions you have with your health care provider. Document Released: 06/19/2001 Document Revised: 12/01/2015 Document Reviewed: 08/26/2012 [employer] Interactive Patient Education  2017 [employer]   Breastfeeding Deciding to breastfeed is one of the best choices you can make for you and your baby. A change in hormones during pregnancy causes your breast tissue to grow and increases the number and size of your  milk ducts. These hormones also allow proteins, sugars, and fats from your blood supply to make breast milk in your milk-producing glands. Hormones prevent breast milk from being released before your baby is born as well as prompt milk flow after birth. Once breastfeeding has begun, thoughts of your baby, as well as his or her sucking or crying, can stimulate the release of milk from your milk-producing glands. Benefits of breastfeeding For Your Baby  Your first milk (colostrum) helps your baby's digestive system function better.  There are antibodies in your milk that help your baby fight off infections.  Your baby has a lower incidence of asthma, allergies, and sudden infant death syndrome.  The nutrients in breast milk are better for your baby than infant formulas and are designed uniquely for your baby's needs.  Breast milk improves your baby's brain development.  Your baby is less likely to develop other conditions, such as childhood obesity, asthma, or type 2 diabetes mellitus. For You  Breastfeeding helps to create a very special bond between you and your baby.  Breastfeeding is convenient. Breast milk is always available at the correct temperature and costs nothing.  Breastfeeding helps to burn calories and helps you lose the weight gained during pregnancy.  Breastfeeding makes your uterus contract to its prepregnancy size faster and slows bleeding (lochia) after you give birth.  Breastfeeding helps to lower your risk of developing type 2 diabetes mellitus, osteoporosis, and breast or ovarian cancer later in life. Signs that your baby is hungry Early Signs of Hunger  Increased alertness or activity.  Stretching.  Movement of the head from side to side.  Movement of the head and opening of the mouth when the corner of the mouth or cheek is stroked (rooting).  Increased sucking sounds, smacking lips, cooing, sighing, or squeaking.  Hand-to-mouth movements.  Increased  sucking of fingers or hands. Late Signs of Hunger  Fussing.  Intermittent crying. Extreme Signs of Hunger  Signs of extreme hunger will require calming and consoling before your baby will   be able to breastfeed successfully. Do not wait for the following signs of extreme hunger to occur before you initiate breastfeeding:  Restlessness.  A loud, strong cry.  Screaming. Breastfeeding basics  Breastfeeding Initiation  Find a comfortable place to sit or lie down, with your neck and back well supported.  Place a pillow or rolled up blanket under your baby to bring him or her to the level of your breast (if you are seated). Nursing pillows are specially designed to help support your arms and your baby while you breastfeed.  Make sure that your baby's abdomen is facing your abdomen.  Gently massage your breast. With your fingertips, massage from your chest wall toward your nipple in a circular motion. This encourages milk flow. You may need to continue this action during the feeding if your milk flows slowly.  Support your breast with 4 fingers underneath and your thumb above your nipple. Make sure your fingers are well away from your nipple and your baby's mouth.  Stroke your baby's lips gently with your finger or nipple.  When your baby's mouth is open wide enough, quickly bring your baby to your breast, placing your entire nipple and as much of the colored area around your nipple (areola) as possible into your baby's mouth.  More areola should be visible above your baby's upper lip than below the lower lip.  Your baby's tongue should be between his or her lower gum and your breast.  Ensure that your baby's mouth is correctly positioned around your nipple (latched). Your baby's lips should create a seal on your breast and be turned out (everted).  It is common for your baby to suck about 2-3 minutes in order to start the flow of breast milk. Latching  Teaching your baby how to latch  on to your breast properly is very important. An improper latch can cause nipple pain and decreased milk supply for you and poor weight gain in your baby. Also, if your baby is not latched onto your nipple properly, he or she may swallow some air during feeding. This can make your baby fussy. Burping your baby when you switch breasts during the feeding can help to get rid of the air. However, teaching your baby to latch on properly is still the best way to prevent fussiness from swallowing air while breastfeeding. Signs that your baby has successfully latched on to your nipple:  Silent tugging or silent sucking, without causing you pain.  Swallowing heard between every 3-4 sucks.  Muscle movement above and in front of his or her ears while sucking. Signs that your baby has not successfully latched on to nipple:  Sucking sounds or smacking sounds from your baby while breastfeeding.  Nipple pain. If you think your baby has not latched on correctly, slip your finger into the corner of your baby's mouth to break the suction and place it between your baby's gums. Attempt breastfeeding initiation again. Signs of Successful Breastfeeding  Signs from your baby:  A gradual decrease in the number of sucks or complete cessation of sucking.  Falling asleep.  Relaxation of his or her body.  Retention of a small amount of milk in his or her mouth.  Letting go of your breast by himself or herself. Signs from you:  Breasts that have increased in firmness, weight, and size 1-3 hours after feeding.  Breasts that are softer immediately after breastfeeding.  Increased milk volume, as well as a change in milk consistency and color by   the fifth day of breastfeeding.  Nipples that are not sore, cracked, or bleeding. Signs That Your Baby is Getting Enough Milk  Wetting at least 1-2 diapers during the first 24 hours after birth.  Wetting at least 5-6 diapers every 24 hours for the first week after  birth. The urine should be clear or pale yellow by 5 days after birth.  Wetting 6-8 diapers every 24 hours as your baby continues to grow and develop.  At least 3 stools in a 24-hour period by age 5 days. The stool should be soft and yellow.  At least 3 stools in a 24-hour period by age 7 days. The stool should be seedy and yellow.  No loss of weight greater than 10% of birth weight during the first 3 days of age.  Average weight gain of 4-7 ounces (113-198 g) per week after age 4 days.  Consistent daily weight gain by age 5 days, without weight loss after the age of 2 weeks. After a feeding, your baby may spit up a small amount. This is common. Breastfeeding frequency and duration Frequent feeding will help you make more milk and can prevent sore nipples and breast engorgement. Breastfeed when you feel the need to reduce the fullness of your breasts or when your baby shows signs of hunger. This is called "breastfeeding on demand." Avoid introducing a pacifier to your baby while you are working to establish breastfeeding (the first 4-6 weeks after your baby is born). After this time you may choose to use a pacifier. Research has shown that pacifier use during the first year of a baby's life decreases the risk of sudden infant death syndrome (SIDS). Allow your baby to feed on each breast as long as he or she wants. Breastfeed until your baby is finished feeding. When your baby unlatches or falls asleep while feeding from the first breast, offer the second breast. Because newborns are often sleepy in the first few weeks of life, you may need to awaken your baby to get him or her to feed. Breastfeeding times will vary from baby to baby. However, the following rules can serve as a guide to help you ensure that your baby is properly fed:  Newborns (babies 4 weeks of age or younger) may breastfeed every 1-3 hours.  Newborns should not go longer than 3 hours during the day or 5 hours during the night  without breastfeeding.  You should breastfeed your baby a minimum of 8 times in a 24-hour period until you begin to introduce solid foods to your baby at around 6 months of age. Breast milk pumping Pumping and storing breast milk allows you to ensure that your baby is exclusively fed your breast milk, even at times when you are unable to breastfeed. This is especially important if you are going back to work while you are still breastfeeding or when you are not able to be present during feedings. Your lactation consultant can give you guidelines on how long it is safe to store breast milk. A breast pump is a machine that allows you to pump milk from your breast into a sterile bottle. The pumped breast milk can then be stored in a refrigerator or freezer. Some breast pumps are operated by hand, while others use electricity. Ask your lactation consultant which type will work best for you. Breast pumps can be purchased, but some hospitals and breastfeeding support groups lease breast pumps on a monthly basis. A lactation consultant can teach you how to   hand express breast milk, if you prefer not to use a pump. Caring for your breasts while you breastfeed Nipples can become dry, cracked, and sore while breastfeeding. The following recommendations can help keep your breasts moisturized and healthy:  Avoid using soap on your nipples.  Wear a supportive bra. Although not required, special nursing bras and tank tops are designed to allow access to your breasts for breastfeeding without taking off your entire bra or top. Avoid wearing underwire-style bras or extremely tight bras.  Air dry your nipples for 3-4minutes after each feeding.  Use only cotton bra pads to absorb leaked breast milk. Leaking of breast milk between feedings is normal.  Use lanolin on your nipples after breastfeeding. Lanolin helps to maintain your skin's normal moisture barrier. If you use pure lanolin, you do not need to wash it off  before feeding your baby again. Pure lanolin is not toxic to your baby. You may also hand express a few drops of breast milk and gently massage that milk into your nipples and allow the milk to air dry. In the first few weeks after giving birth, some women experience extremely full breasts (engorgement). Engorgement can make your breasts feel heavy, warm, and tender to the touch. Engorgement peaks within 3-5 days after you give birth. The following recommendations can help ease engorgement:  Completely empty your breasts while breastfeeding or pumping. You may want to start by applying warm, moist heat (in the shower or with warm water-soaked hand towels) just before feeding or pumping. This increases circulation and helps the milk flow. If your baby does not completely empty your breasts while breastfeeding, pump any extra milk after he or she is finished.  Wear a snug bra (nursing or regular) or tank top for 1-2 days to signal your body to slightly decrease milk production.  Apply ice packs to your breasts, unless this is too uncomfortable for you.  Make sure that your baby is latched on and positioned properly while breastfeeding. If engorgement persists after 48 hours of following these recommendations, contact your health care provider or a lactation consultant. Overall health care recommendations while breastfeeding  Eat healthy foods. Alternate between meals and snacks, eating 3 of each per day. Because what you eat affects your breast milk, some of the foods may make your baby more irritable than usual. Avoid eating these foods if you are sure that they are negatively affecting your baby.  Drink milk, fruit juice, and water to satisfy your thirst (about 10 glasses a day).  Rest often, relax, and continue to take your prenatal vitamins to prevent fatigue, stress, and anemia.  Continue breast self-awareness checks.  Avoid chewing and smoking tobacco. Chemicals from cigarettes that pass  into breast milk and exposure to secondhand smoke may harm your baby.  Avoid alcohol and drug use, including marijuana. Some medicines that may be harmful to your baby can pass through breast milk. It is important to ask your health care provider before taking any medicine, including all over-the-counter and prescription medicine as well as vitamin and herbal supplements. It is possible to become pregnant while breastfeeding. If birth control is desired, ask your health care provider about options that will be safe for your baby. Contact a health care provider if:  You feel like you want to stop breastfeeding or have become frustrated with breastfeeding.  You have painful breasts or nipples.  Your nipples are cracked or bleeding.  Your breasts are red, tender, or warm.  You have   a swollen area on either breast.  You have a fever or chills.  You have nausea or vomiting.  You have drainage other than breast milk from your nipples.  Your breasts do not become full before feedings by the fifth day after you give birth.  You feel sad and depressed.  Your baby is too sleepy to eat well.  Your baby is having trouble sleeping.  Your baby is wetting less than 3 diapers in a 24-hour period.  Your baby has less than 3 stools in a 24-hour period.  Your baby's skin or the white part of his or her eyes becomes yellow.  Your baby is not gaining weight by 5 days of age. Get help right away if:  Your baby is overly tired (lethargic) and does not want to wake up and feed.  Your baby develops an unexplained fever. This information is not intended to replace advice given to you by your health care provider. Make sure you discuss any questions you have with your health care provider. Document Released: 06/25/2005 Document Revised: 12/07/2015 Document Reviewed: 12/17/2012 Elsevier Interactive Patient Education  2017 Elsevier Inc.  

## 2016-08-08 NOTE — Progress Notes (Signed)
Burmese interpreter #[id number]    PRENATAL VISIT NOTE  Subjective:  [name] is a 36 y.o. [id number] at 2533w4d being seen today for ongoing prenatal care.  She is currently monitored for the following issues for this low-risk pregnancy and has History of preterm delivery, currently pregnant; Language barrier, speaks Burnese only; and Supervision of high risk pregnancy, antepartum on her problem list.  Patient reports vaginal irritation. Reports pain in her bladder x 2 days. Pain is constant. Reports white discharge. Contractions: Not present.  .  Movement: Present. Denies leaking of fluid.   The following portions of the patient's history were reviewed and updated as appropriate: allergies, current medications, past family history, past medical history, past social history, past surgical history and problem list. Problem list updated.  Objective:   Vitals:   08/08/16 1505  BP: 111/83  Pulse: 83  Weight: 179 lb (81.2 kg)    Fetal Status: Fetal Heart Rate (bpm): 164 Fundal Height: 35 cm Movement: Present  Presentation: Vertex  General:  Alert, oriented and cooperative. Patient is in no acute distress.  Skin: Skin is warm and dry. No rash noted.   Cardiovascular: Normal heart rate noted  Respiratory: Normal respiratory effort, no problems with respiration noted  Abdomen: Soft, gravid, appropriate for gestational age. Pain/Pressure: Absent     Pelvic:  Cervical exam deferred        Extremities: Normal range of motion.  Edema: None  Mental Status: Normal mood and affect. Normal behavior. Normal judgment and thought content.   Assessment and Plan:  Pregnancy: G3P1102 at 3133w4d  1. Supervision of high risk pregnancy, antepartum Continue routine prenatal care.  - prenatal vitamin w/FE, FA (PRENATAL 1 + 1) 27-1 MG TABS tablet; Take 1 tablet by mouth daily.  Dispense: 30 each; Refill: 6  2. History of preterm delivery, currently pregnant Declines 17 P  3. Vaginal discharge Treat  presumptively - terconazole (TERAZOL 3) 0.8 % vaginal cream; Place 1 applicator vaginally at bedtime.  Dispense: 20 g; Refill: 0  Preterm labor symptoms and general obstetric precautions including but not limited to vaginal bleeding, contractions, leaking of fluid and fetal movement were reviewed in detail with the patient. Please refer to After Visit Summary for other counseling recommendations.  Return in 1 week (on 08/15/2016).   Reva Boresanya S Star Cheese, MD

## 2016-08-17 ENCOUNTER — Ambulatory Visit (INDEPENDENT_AMBULATORY_CARE_PROVIDER_SITE_OTHER): Payer: Medicaid Other | Admitting: Family Medicine

## 2016-08-17 VITALS — BP 94/73 | HR 79 | Wt 177.4 lb

## 2016-08-17 DIAGNOSIS — O0990 Supervision of high risk pregnancy, unspecified, unspecified trimester: Secondary | ICD-10-CM

## 2016-08-17 DIAGNOSIS — O09213 Supervision of pregnancy with history of pre-term labor, third trimester: Secondary | ICD-10-CM

## 2016-08-17 DIAGNOSIS — Z789 Other specified health status: Secondary | ICD-10-CM

## 2016-08-17 NOTE — Progress Notes (Signed)
   PRENATAL VISIT NOTE  Subjective:  [name] is a 36 y.o. [id number] at 3275w6d being seen today for ongoing prenatal care.  She is currently monitored for the following issues for this low-risk pregnancy and has History of preterm delivery, currently pregnant; Language barrier, speaks Burnese only; and Supervision of high risk pregnancy, antepartum on her problem list.  Patient reports no complaints.  Contractions: Not present.  .  Movement: Present. Denies leaking of fluid.   The following portions of the patient's history were reviewed and updated as appropriate: allergies, current medications, past family history, past medical history, past social history, past surgical history and problem list. Problem list updated.  Objective:   Vitals:   08/17/16 1108  BP: 94/73  Pulse: 79  Weight: 177 lb 6.4 oz (80.5 kg)    Fetal Status: Fetal Heart Rate (bpm): 165   Movement: Present     General:  Alert, oriented and cooperative. Patient is in no acute distress.  Skin: Skin is warm and dry. No rash noted.   Cardiovascular: Normal heart rate noted  Respiratory: Normal respiratory effort, no problems with respiration noted  Abdomen: Soft, gravid, appropriate for gestational age. Pain/Pressure: Absent     Pelvic:  Cervical exam deferred        Extremities: Normal range of motion.  Edema: Trace  Mental Status: Normal mood and affect. Normal behavior. Normal judgment and thought content.   Assessment and Plan:  Pregnancy: G3P1102 at 8175w6d  1. Supervision of high risk pregnancy, antepartum FHT and FH normal  2. Language barrier, speaks Burnese only Interpreter used  Term labor symptoms and general obstetric precautions including but not limited to vaginal bleeding, contractions, leaking of fluid and fetal movement were reviewed in detail with the patient. Please refer to After Visit Summary for other counseling recommendations.  No Follow-up on file.   Levie HeritageJacob J Stinson, DO

## 2016-08-20 ENCOUNTER — Inpatient Hospital Stay (HOSPITAL_COMMUNITY)
Admission: AD | Admit: 2016-08-20 | Discharge: 2016-08-22 | DRG: 775 | Disposition: A | Discharge status: Home / Self Care | Payer: Medicaid Other | Source: Ambulatory Visit | Attending: Obstetrics and Gynecology | Admitting: Obstetrics and Gynecology

## 2016-08-20 ENCOUNTER — Encounter (HOSPITAL_COMMUNITY): Payer: Self-pay

## 2016-08-20 DIAGNOSIS — Z3A38 38 weeks gestation of pregnancy: Secondary | ICD-10-CM

## 2016-08-20 DIAGNOSIS — O09219 Supervision of pregnancy with history of pre-term labor, unspecified trimester: Secondary | ICD-10-CM

## 2016-08-20 DIAGNOSIS — O0990 Supervision of high risk pregnancy, unspecified, unspecified trimester: Secondary | ICD-10-CM

## 2016-08-20 DIAGNOSIS — Z3493 Encounter for supervision of normal pregnancy, unspecified, third trimester: Secondary | ICD-10-CM | POA: Diagnosis present

## 2016-08-20 DIAGNOSIS — O09899 Supervision of other high risk pregnancies, unspecified trimester: Secondary | ICD-10-CM

## 2016-08-20 LAB — COMPREHENSIVE METABOLIC PANEL
ALBUMIN: 3 g/dL — AB (ref 3.5–5.0)
ALT: 12 U/L — AB (ref 14–54)
ANION GAP: 9 (ref 5–15)
AST: 18 U/L (ref 15–41)
Alkaline Phosphatase: 147 U/L — ABNORMAL HIGH (ref 38–126)
BUN: 10 mg/dL (ref 6–20)
CHLORIDE: 105 mmol/L (ref 101–111)
CO2: 22 mmol/L (ref 22–32)
CREATININE: 0.62 mg/dL (ref 0.44–1.00)
Calcium: 9.6 mg/dL (ref 8.9–10.3)
Glucose, Bld: 79 mg/dL (ref 65–99)
POTASSIUM: 4.3 mmol/L (ref 3.5–5.1)
SODIUM: 136 mmol/L (ref 135–145)
Total Bilirubin: 0.3 mg/dL (ref 0.3–1.2)
Total Protein: 6.4 g/dL — ABNORMAL LOW (ref 6.5–8.1)

## 2016-08-20 LAB — RPR: RPR Ser Ql: NONREACTIVE

## 2016-08-20 LAB — CBC
HEMATOCRIT: 38.4 % (ref 36.0–46.0)
HEMOGLOBIN: 12.7 g/dL (ref 12.0–15.0)
MCH: 26.8 pg (ref 26.0–34.0)
MCHC: 33.1 g/dL (ref 30.0–36.0)
MCV: 81.2 fL (ref 78.0–100.0)
Platelets: 218 10*3/uL (ref 150–400)
RBC: 4.73 MIL/uL (ref 3.87–5.11)
RDW: 14.5 % (ref 11.5–15.5)
WBC: 11.5 10*3/uL — ABNORMAL HIGH (ref 4.0–10.5)

## 2016-08-20 LAB — TYPE AND SCREEN
ABO/RH(D): O POS
Antibody Screen: NEGATIVE

## 2016-08-20 MED ORDER — LACTATED RINGERS IV SOLN: INTRAVENOUS | Status: DC

## 2016-08-20 MED ORDER — OXYCODONE-ACETAMINOPHEN 5-325 MG PO TABS
1.0000 | ORAL_TABLET | ORAL | Status: DC | PRN
  Administered 2016-08-20: 1 via ORAL
  Filled 2016-08-20: qty 1

## 2016-08-20 MED ORDER — WITCH HAZEL-GLYCERIN EX PADS: 1.0000 "application " | MEDICATED_PAD | CUTANEOUS | Status: DC | PRN

## 2016-08-20 MED ORDER — ACETAMINOPHEN 325 MG PO TABS
650.0000 mg | ORAL_TABLET | ORAL | Status: DC | PRN
  Administered 2016-08-20 – 2016-08-21 (×4): 650 mg via ORAL
  Filled 2016-08-20 (×2): qty 2

## 2016-08-20 MED ORDER — ZOLPIDEM TARTRATE 5 MG PO TABS: 5.0000 mg | ORAL_TABLET | Freq: Every evening | ORAL | Status: DC | PRN

## 2016-08-20 MED ORDER — ONDANSETRON HCL 4 MG/2ML IJ SOLN: 4.0000 mg | INTRAMUSCULAR | Status: DC | PRN

## 2016-08-20 MED ORDER — OXYTOCIN BOLUS FROM INFUSION
500.0000 mL | Freq: Once | INTRAVENOUS | Status: AC
  Administered 2016-08-20: 500 mL via INTRAVENOUS

## 2016-08-20 MED ORDER — FERROUS SULFATE 325 (65 FE) MG PO TABS
325.0000 mg | ORAL_TABLET | Freq: Two times a day (BID) | ORAL | Status: DC
  Administered 2016-08-20 – 2016-08-22 (×4): 325 mg via ORAL
  Filled 2016-08-20 (×4): qty 1

## 2016-08-20 MED ORDER — ONDANSETRON HCL 4 MG/2ML IJ SOLN: 4.0000 mg | Freq: Four times a day (QID) | INTRAMUSCULAR | Status: DC | PRN

## 2016-08-20 MED ORDER — FLEET ENEMA 7-19 GM/118ML RE ENEM
1.0000 | ENEMA | RECTAL | Status: DC | PRN
Start: 2016-08-20 — End: 2016-08-20

## 2016-08-20 MED ORDER — MAGNESIUM HYDROXIDE 400 MG/5ML PO SUSP: 30.0000 mL | ORAL | Status: DC | PRN

## 2016-08-20 MED ORDER — ACETAMINOPHEN 325 MG PO TABS: 650.0000 mg | ORAL_TABLET | ORAL | Status: DC | PRN

## 2016-08-20 MED ORDER — LIDOCAINE HCL (PF) 1 % IJ SOLN
INTRAMUSCULAR | Status: AC
  Administered 2016-08-20: 30 mL
  Filled 2016-08-20: qty 30

## 2016-08-20 MED ORDER — OXYTOCIN 40 UNITS IN LACTATED RINGERS INFUSION - SIMPLE MED: 2.5000 [IU]/h | INTRAVENOUS | Status: DC

## 2016-08-20 MED ORDER — OXYCODONE-ACETAMINOPHEN 5-325 MG PO TABS: 2.0000 | ORAL_TABLET | ORAL | Status: DC | PRN

## 2016-08-20 MED ORDER — LACTATED RINGERS IV SOLN: 500.0000 mL | INTRAVENOUS | Status: DC | PRN

## 2016-08-20 MED ORDER — DIPHENHYDRAMINE HCL 25 MG PO CAPS: 25.0000 mg | ORAL_CAPSULE | Freq: Four times a day (QID) | ORAL | Status: DC | PRN

## 2016-08-20 MED ORDER — BENZOCAINE-MENTHOL 20-0.5 % EX AERO
1.0000 "application " | INHALATION_SPRAY | CUTANEOUS | Status: DC | PRN
  Administered 2016-08-20: 1 via TOPICAL
  Filled 2016-08-20: qty 56

## 2016-08-20 MED ORDER — SOD CITRATE-CITRIC ACID 500-334 MG/5ML PO SOLN: 30.0000 mL | ORAL | Status: DC | PRN

## 2016-08-20 MED ORDER — SIMETHICONE 80 MG PO CHEW: 80.0000 mg | CHEWABLE_TABLET | ORAL | Status: DC | PRN

## 2016-08-20 MED ORDER — IBUPROFEN 600 MG PO TABS
600.0000 mg | ORAL_TABLET | Freq: Four times a day (QID) | ORAL | Status: DC
  Administered 2016-08-20 – 2016-08-22 (×9): 600 mg via ORAL
  Filled 2016-08-20 (×9): qty 1

## 2016-08-20 MED ORDER — MEASLES, MUMPS & RUBELLA VAC ~~LOC~~ INJ
0.5000 mL | INJECTION | Freq: Once | SUBCUTANEOUS | Status: DC
  Filled 2016-08-20: qty 0.5

## 2016-08-20 MED ORDER — LIDOCAINE HCL (PF) 1 % IJ SOLN
30.0000 mL | INTRAMUSCULAR | Status: DC | PRN
  Filled 2016-08-20: qty 30

## 2016-08-20 MED ORDER — TETANUS-DIPHTH-ACELL PERTUSSIS 5-2.5-18.5 LF-MCG/0.5 IM SUSP: 0.5000 mL | Freq: Once | INTRAMUSCULAR | Status: DC

## 2016-08-20 MED ORDER — COCONUT OIL OIL: 1.0000 "application " | TOPICAL_OIL | Status: DC | PRN

## 2016-08-20 MED ORDER — FENTANYL CITRATE (PF) 100 MCG/2ML IJ SOLN: 50.0000 ug | INTRAMUSCULAR | Status: DC | PRN

## 2016-08-20 MED ORDER — OXYTOCIN 40 UNITS IN LACTATED RINGERS INFUSION - SIMPLE MED
INTRAVENOUS | Status: AC
  Filled 2016-08-20: qty 1000

## 2016-08-20 MED ORDER — PRENATAL MULTIVITAMIN CH
1.0000 | ORAL_TABLET | Freq: Every day | ORAL | Status: DC
  Administered 2016-08-20 – 2016-08-22 (×3): 1 via ORAL
  Filled 2016-08-20 (×3): qty 1

## 2016-08-20 MED ORDER — DIBUCAINE 1 % RE OINT: 1.0000 "application " | TOPICAL_OINTMENT | RECTAL | Status: DC | PRN

## 2016-08-20 MED ORDER — ONDANSETRON HCL 4 MG PO TABS: 4.0000 mg | ORAL_TABLET | ORAL | Status: DC | PRN

## 2016-08-20 NOTE — Progress Notes (Signed)
Rn encouraged mom not to sleep with baby.

## 2016-08-20 NOTE — Lactation Note (Signed)
This note was copied from a baby's chart. Lactation Consultation Note Initial visit with Burmese video interpreter ID # [id number].  LC was able to assist mom with hand expression of several drops and mom is able to return demonstration prior to video interpreter.  LC reviewed with interpreter and encouraged mom to hand express prior to latching baby.  Mom denies pain or concerns related to breastfeeding.  Mom plans to breast feed this baby for 1 year or more. [facility] given and discussed.  Encouraged to feed with early cues on demand.  Early newborn behavior discussed.  Hand expression demonstrated by mom with colostrum visible.  Mom aware [facility]. Mom to call for assist as needed.    Patient Name: [name] LKGMW'NToday's Date: 08/20/2016 Reason for consult: Initial assessment   Maternal Data Has patient been taught Hand Expression?: Yes Does the patient have breastfeeding experience prior to this delivery?: Yes  Feeding Feeding Type: Breast Fed Length of feed: 10 min  LATCH Score/Interventions                Intervention(s): Breastfeeding basics reviewed;Skin to skin     Lactation Tools Discussed/Used WIC Program: Yes   Consult Status Consult Status: Follow-up Date: 08/21/16 Follow-up type: In-patient    [name], Sierra MerlesJana Velasquez 08/20/2016, 5:20 PM

## 2016-08-20 NOTE — H&P (Signed)
HPI: [name] Ismael is a 36 y.o. year old 373P2103 female at 4552w2d weeks gestation who presents to [facility] reporting Labor. Unsure when water broke, but thinks it was after arriving to hospital this morning. Denies VB. Normal fetal mvmt.   Clinic [facility] Prenatal Labs  Dating LMP = 20 wk us Blood type: O/POS/-- (09/21 1508)   Genetic Screen  Quad:  negative Antibody:NEG (09/21 1508)  Anatomic US Normal Rubella: 2.04 (09/21 1508)  GTT  Third trimester: normal RPR: NON REAC (09/21 1508)   Flu vaccine  06/15/16  HBsAg: NEGATIVE (09/21 1508)   TDaP vaccine  06/15/16 HIV: NONREACTIVE (09/21 1508)   Baby Food    breast                                           XLK:GMWNUUVOGBS:Negative  Contraception Nexplanon Pap: Normal 2016  Circumcision N/a girl    [facility]   Support Person [name] (husband)    Patient Active Problem List   Diagnosis Date Noted  . Indication for care in labor or delivery 08/20/2016  . History of preterm delivery, currently pregnant 03/29/2016  . Supervision of high risk pregnancy, antepartum 03/29/2016  . Language barrier, speaks Burnese only 02/10/2015     OB History    Gravida Para Term Preterm AB Living   3 3 2 1  0 3   SAB TAB Ectopic Multiple Live Births   0 0 0 0 3     Past Medical History:  Diagnosis Date  . Headache(784.0)   . PPD positive in 2012    Was taking meds, but did not complete course due to pregnancy. Neg CXR. Never had Sx.   Past Surgical History:  Procedure Laterality Date  . NO PAST SURGERIES     Family History: family history is not on file. Social History:  reports that she has never smoked. She has never used smokeless tobacco. She reports that she does not drink alcohol or use drugs.     Maternal Diabetes: No Genetic Screening: Normal Maternal Ultrasounds/Referrals: Normal Fetal Ultrasounds or other Referrals:  None Maternal Substance Abuse:  No Significant Maternal Medications:  None Significant Maternal Lab Results:  Lab  values include: Group B Strep negative, Other:  Other Comments:  Hx pos TB test in 2012. Neg CXR. Put on ABX, but stopped after 5-6 month due to finding out that she was pregnant. No further testing or Tx since then per pt. Has never had Sx.   Review of Systems  Constitutional: Negative for chills and fever.       Neg for night sweats  Eyes: Negative for blurred vision.  Respiratory: Negative for cough, hemoptysis and sputum production.   Gastrointestinal: Positive for abdominal pain (contractions only ).  Neurological: Negative for headaches.   Maternal Medical History:  Reason for admission: Contractions.   Contractions: Onset was 1-2 hours ago.    Fetal activity: Perceived fetal activity is normal.   Last perceived fetal movement was within the past hour.    Prenatal Complications - Diabetes: none.    Dilation: 10 Effacement (%): 100 Station: 0, +1 Exam by:: Violeta GelinasG Whitfield, RN Blood pressure 113/78, pulse 64, temperature 99.4 F (37.4 C), temperature source Oral, resp. rate 20, last menstrual period 11/26/2015, unknown if currently breastfeeding. Maternal Exam:  Uterine Assessment: Contraction strength is firm.  Contraction frequency is  regular.   Abdomen: Patient reports no abdominal tenderness. Estimated fetal weight is 7 lb.   Fetal presentation: vertex  Introitus: Normal vulva. Normal vagina.  Amniotic fluid character: clear.     Fetal Exam Fetal Monitor Review: Mode: ultrasound.   Baseline rate: 130.  Variability: moderate (6-25 bpm).   Pattern: accelerations present and variable decelerations.    Fetal State Assessment: Category I - tracings are normal.     Physical Exam  Nursing note and vitals reviewed. Constitutional: She is oriented to person, place, and time. She appears well-developed and well-nourished. She appears distressed.  Eyes: No scleral icterus.  Cardiovascular: Normal rate.   Respiratory: Effort normal. No respiratory distress.  GI: Soft.  There is no tenderness.  Genitourinary: Vagina normal.  Musculoskeletal: She exhibits edema (1+).  Neurological: She is alert and oriented to person, place, and time. She has normal reflexes.  Skin: Skin is warm and dry.  Psychiatric: She has a normal mood and affect.    Prenatal labs: ABO, Rh: --/--/O POS (02/12 4098) Antibody: NEG (02/12 1191) Rubella: 2.04 (09/21 1508) RPR: NON REAC (12/08 1101)  HBsAg: NEGATIVE (09/21 1508)  HIV: NONREACTIVE (12/08 1101)  GBS: Negative (01/25 0000)   Assessment: 1. Labor: Active 2. Fetal Wellbeing: Category I  3. Pain Control: None 4. GBS: Neg 5. 38.2 week IUP 6. Single elevated BP w/ out Sx Pre-E. 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   Plan:  1. Admit to BS per consult with MD 2. Routine L&D orders 3. Analgesia/anesthesia PRN  4. Pre-E labs 5. Infection Controlled called RE: TB. Mom and baby do NOT need to be on precautions.   Dorathy Kinsman 08/20/2016, 9:40 AM

## 2016-08-20 NOTE — MAU Note (Signed)
Pt c/o pain that started at 5am that feels like contractions. Denies LOF. Has a mucous discharge with some streaks of blood. +FM.

## 2016-08-20 NOTE — Progress Notes (Signed)
UR chart review completed.  

## 2016-08-21 ENCOUNTER — Other Ambulatory Visit: Payer: Self-pay | Admitting: Family Medicine

## 2016-08-21 MED ORDER — IBUPROFEN 600 MG PO TABS: 600.0000 mg | ORAL_TABLET | Freq: Four times a day (QID) | ORAL | 0 refills | Status: DC

## 2016-08-21 MED ORDER — DOCUSATE SODIUM 100 MG PO CAPS: 100.0000 mg | ORAL_CAPSULE | Freq: Two times a day (BID) | ORAL | 0 refills | Status: DC

## 2016-08-21 NOTE — Discharge Summary (Signed)
OB Discharge Summary     Patient Name: Jerelyn Charlesang Yi Yi Win Neidhardt DOB: 05/29/1981 MRN: [id number]  Date of admission: 08/20/2016 Delivering MD: Dorathy KinsmanSMITH, [place]   Date of discharge: 08/22/2016  Admitting diagnosis: 39 weeks in labor Intrauterine pregnancy: 4377w2d     Secondary diagnosis:  Active Problems:   Indication for care in labor or delivery  Additional problems: Questionable TB, needs HD visit PP     Discharge diagnosis: Term Pregnancy Delivered                                                                                                Post partum procedures:none  Augmentation: none  Complications: None  Hospital course:  Onset of Labor With Vaginal Delivery     36 y.o. yo [id number] at 1177w2d was admitted in Active Labor on 08/20/2016. Patient had an uncomplicated labor course as follows:  Membrane Rupture Time/Date: 6:00 AM ,08/20/2016   Intrapartum Procedures: Episiotomy: None [1]                                         Lacerations:  2nd degree [3]  Patient had a delivery of a Viable infant. 08/20/2016  Information for the patient's newborn:  [name], Girl Darleene Cleaverang [[id number]][[id number]]  Delivery Method: Vaginal, Spontaneous Delivery (Filed from Delivery Summary)    Pateint had an uncomplicated postpartum course.  She is ambulating, tolerating a regular diet, passing flatus, and urinating well. Patient is discharged home in stable condition on 08/22/16. Patient is to follow up with the health department for further workup/treatment for her +PPD (neg CXR), due to an incomplete course of treatment.   Physical exam  Vitals:   08/20/16 1929 08/21/16 0627 08/21/16 1755 08/22/16 0526  BP: 100/62 99/66 111/70 100/65  Pulse: 70 66 82 70  Resp: 18 18 18 18   Temp: 98.3 F (36.8 C) 98.3 F (36.8 C) 98.2 F (36.8 C) 98.2 F (36.8 C)  TempSrc: Oral Oral Oral Oral   General: alert Lochia: inappropriate Uterine Fundus: firm Incision: N/A DVT Evaluation: No evidence of DVT seen on physical  exam. Labs: Lab Results  Component Value Date   WBC 11.5 (H) 08/20/2016   HGB 12.7 08/20/2016   HCT 38.4 08/20/2016   MCV 81.2 08/20/2016   PLT 218 08/20/2016   CMP Latest Ref Rng & Units 08/20/2016  Glucose 65 - 99 mg/dL 79  BUN 6 - 20 mg/dL 10  Creatinine 4.010.44 - 0.271.00 mg/dL 2.530.62  Sodium 664135 - 403145 mmol/L 136  Potassium 3.5 - 5.1 mmol/L 4.3  Chloride 101 - 111 mmol/L 105  CO2 22 - 32 mmol/L 22  Calcium 8.9 - 10.3 mg/dL 9.6  Total Protein 6.5 - 8.1 g/dL 6.4(L)  Total Bilirubin 0.3 - 1.2 mg/dL 0.3  Alkaline Phos 38 - 126 U/L 147(H)  AST 15 - 41 U/L 18  ALT 14 - 54 U/L 12(L)    Discharge instruction: per After Visit Summary and "Baby and Me Booklet".  After visit meds:  Allergies as of 08/22/2016   No Known Allergies     Medication List    STOP taking these medications   prenatal vitamin w/FE, FA 27-1 MG Tabs tablet   terconazole 0.8 % vaginal cream Commonly known as:  TERAZOL 3     TAKE these medications   docusate sodium 100 MG capsule Commonly known as:  COLACE Take 1 capsule (100 mg total) by mouth 2 (two) times daily.   ibuprofen 600 MG tablet Commonly known as:  ADVIL,MOTRIN Take 1 tablet (600 mg total) by mouth every 6 (six) hours.       Diet: routine diet  Activity: Advance as tolerated. Pelvic rest for 6 weeks.   Outpatient follow up:6 weeks also needs HD appointment for TB workup Follow up Appt: Future Appointments Date Time Provider Department Center  10/01/2016 2:40 PM [facility] [place], [place] [facility]   Follow up Visit:No Follow-up on file.  Postpartum contraception: Nexplanon  Newborn Data: Live born female  Birth Weight: 6 lb 14.8 oz (3140 g) APGAR: 8, 9  Baby Feeding: Breast Disposition:home with mother   08/22/2016 Jen Mow, DO OB Fellow

## 2016-08-21 NOTE — Lactation Note (Addendum)
This note was copied from a baby's chart. Lactation Consultation Note Follow up visit at 34 hours of age with video Burmese interpreter [id number].  RN requested visit due to elevated bili and temperature of 100.9 with good output 1st 24 hours of life and not since then. RN reports good feedings for short duration she observed.  Mom holding baby fussy in 2 layers of clothing. Lc encouraged mom to undress baby.  Mom placed baby to breast with poor latch sucking sounds and mom is bouncing to keep baby quiet while baby is on and off again fussy.  LC assisted with hand expression to assess for milk volume and was easily able to express a few drops. LC assisted with repositioning in attempt to get deeper latch with wide gape and flanged lips.  Baby sucks a few times and stops acting fussy.  With interpreter mom is still not following instructions on positioning to work on depth for latch. Mom appears tired and anxious.  LC attempted to burp baby, baby did not burp but arches back extends legs.  Baby is rooting, but does not suck well on gloved finger.  Baby noted to have short thin frenulum and bowl shaped tongue during crying.  Baby is not extending tongue well past lower gumline, baby bites and tongue thrusts with minimal suck.  Mom and LC hand expressed and spoon fed baby of EBm.  Baby didn't extend tongue well with mouth open but can with mouth almost closed.    Baby showing more feeding cues and sucking some with poor coordination.  Mom again attempts to latch baby with lips tucked in.  LC unable to correct latch with baby's not doing well and mom not cooperative, just trying to get baby to suck on tip of nipple.  Mom does report understanding through interpreter.   LC impression is that baby is not able to hold breast well due to limited tongue mobility.  LC discussed supplementing with formula and syringe feeding.   Mom agreeable and LC assisted.  Baby first didn't coordinate suck and swallow, but after  a few minutes did well, with clicking sound and then soft swallows audible.  LC burped baby again and continues finger feeding and noted baby does not seal lips tightly when sucking causing milk leakage.  Baby may need to use nipple on bottle for feedings tonight.  Baby took with minimal burp after several minutes of attempting.  LC encouraged mom to continue to work on burping as likely cause of gassiness is poor burping with air sucking at breast for feedings.   LC set up DEBP with instructions and observed mom pump a few drops.  LC instructed mom on washing, set up and storage of EBM.  Mom denies further questions after very lengthy consult with [facility] and interpreter. LC requested tech to get weight check and noted a 5% weight loss in ~18 hours and baby now at 8.9% loss at 34 hours.    Rn at bedside intermittently with report given. LC observed RN given info regarding supplementation of formula and volumes.  LC asked mom to write out feeding plan as Burmese interpreted LC.  Mom has paper at bedside.     Patient Name: [name] GNFAO'Z Date: 08/21/2016 Reason for consult: Follow-up assessment;Difficult latch;Breast/nipple pain;Infant weight loss;MD order   Maternal Data Has patient been taught Hand Expression?: Yes Does the patient have breastfeeding experience prior to this delivery?: Yes  Feeding Feeding Type: Formula Length of feed:  (  worked at feeding >4420min on and off few sucks poor feeding)  LATCH Score/Interventions Latch: Repeated attempts needed to sustain latch, nipple held in mouth throughout feeding, stimulation needed to elicit sucking reflex. Intervention(s): Adjust position;Assist with latch;Breast massage;Breast compression  Audible Swallowing: None Intervention(s): Skin to skin;Hand expression Intervention(s): Alternate breast massage  Type of Nipple: Everted at rest and after stimulation  Comfort (Breast/Nipple): Soft / non-tender     Hold (Positioning):  Assistance needed to correctly position infant at breast and maintain latch. Intervention(s): Breastfeeding basics reviewed;Support Pillows;Position options;Skin to skin  LATCH Score: 6  Lactation Tools Discussed/Used WIC Program: Yes Pump Review: Setup, frequency, and cleaning;Milk Storage Initiated by:: JS IBCLC with Burmese interpreter Date initiated:: 08/21/16   Consult Status Consult Status: Follow-up Date: 08/22/16 Follow-up type: In-patient    [name], [name] 08/21/2016, 7:05 PM

## 2016-08-22 NOTE — Lactation Note (Signed)
This note was copied from a baby's chart. Lactation Consultation Note  Patient Name: [name] ZOXWR'UToday's Date: 08/22/2016 Reason for consult: Follow-up assessment  Baby 51 hours old. Used video interpreter "Khaing" [id number]. Mom reports that she does not need assistance with latching baby as a nurse helped her yesterday with latching, pumping and manual expression. Mom reports that she nursed and used formula with older children, and intends to do the same with this child. Enc mom to nurse often to establish and maintain a good milk supply. Mom requested a manual pump and it was given. Mom had questions about WIC, [facility]. [facility] Mom aware of OP/BFSG and LC phone line assistance after D/C.   Maternal Data    Feeding Feeding Type: Breast Fed Length of feed: 5 min  LATCH Score/Interventions Latch: Grasps breast easily, tongue down, lips flanged, rhythmical sucking.  Audible Swallowing: A few with stimulation  Type of Nipple: Everted at rest and after stimulation  Comfort (Breast/Nipple): Soft / non-tender     Hold (Positioning): No assistance needed to correctly position infant at breast.  LATCH Score: 9  Lactation Tools Discussed/Used     Consult Status Consult Status: Complete    [name] 08/22/2016, 10:02 AM

## 2016-08-22 NOTE — Discharge Instructions (Signed)

## 2016-08-22 NOTE — Progress Notes (Signed)
Patient ID: [name] Sierra Cato MulliganYi Win Velasquez, female   DOB: 10/11/1980, 36 y.o.   MRN: [id number]  POSTPARTUM PROGRESS NOTE  Post Partum Day #1 Subjective:  [name] is a 36 y.o. [phone] [id number] 5661w2d s/p NSVD.  No acute events overnight.  Pt denies problems with ambulating, voiding or po intake.  She denies nausea or vomiting.  Pain is well controlled.  She has had flatus. She has not had bowel movement.  Lochia Small.   Objective: Blood pressure 111/70, pulse 82, temperature 98.2 F (36.8 C), temperature source Oral, resp. rate 18, last menstrual period 11/26/2015, unknown if currently breastfeeding.  Physical Exam:  General: alert, cooperative and no distress Lochia:normal flow Chest: CTAB Heart: RRR no m/r/g Abdomen: +BS, soft, nontender,  Uterine Fundus: firm, below umbilicus DVT Evaluation: No calf swelling or tenderness Extremities: Trace edema   Recent Labs  08/20/16 0625  HGB 12.7  HCT 38.4    Assessment/Plan:  ASSESSMENT: [name] is a 36 y.o. [id number] 10061w2d s/p NSVD  Plan for discharge tomorrow, Breastfeeding, Lactation consult and Contraception Nexplanon.  Reviewed BPs, normal today   LOS: 2 days   Jen MowElizabeth Mumaw, DO OB Fellow [facility], [facility]  08/21/16  11:48 AM

## 2016-08-23 ENCOUNTER — Encounter: Payer: Medicaid Other | Admitting: Student

## 2016-10-01 ENCOUNTER — Ambulatory Visit (INDEPENDENT_AMBULATORY_CARE_PROVIDER_SITE_OTHER): Payer: Medicaid Other | Admitting: Family Medicine

## 2016-10-01 ENCOUNTER — Encounter: Payer: Self-pay | Admitting: Family Medicine

## 2016-10-01 DIAGNOSIS — Z3202 Encounter for pregnancy test, result negative: Secondary | ICD-10-CM

## 2016-10-01 DIAGNOSIS — Z30017 Encounter for initial prescription of implantable subdermal contraceptive: Secondary | ICD-10-CM

## 2016-10-01 DIAGNOSIS — Z3049 Encounter for surveillance of other contraceptives: Secondary | ICD-10-CM | POA: Diagnosis not present

## 2016-10-01 LAB — POCT PREGNANCY, URINE: PREG TEST UR: NEGATIVE

## 2016-10-01 MED ORDER — ETONOGESTREL 68 MG ~~LOC~~ IMPL
68.0000 mg | DRUG_IMPLANT | Freq: Once | SUBCUTANEOUS | Status: AC
  Administered 2016-10-01: 68 mg via SUBCUTANEOUS

## 2016-10-01 NOTE — Progress Notes (Signed)
Video Interpreter # [id number]  Subjective:     [name] is a 36 y.o. female who presents for a postpartum visit. She is 6 weeks postpartum following a spontaneous vaginal delivery. I have fully reviewed the prenatal and intrapartum course. The delivery was at 38.2 gestational weeks. Outcome: spontaneous vaginal delivery. Anesthesia: none. Postpartum course has been uncomplicated. Baby's course has been uncomplicated. Baby is feeding by both breast and bottle - Similac Advance. Bleeding no bleeding. Bowel function is normal. Bladder function is normal. Patient is not sexually active. Contraception method is Nexplanon. Postpartum depression screening: negative.  The following portions of the patient's history were reviewed and updated as appropriate: allergies, current medications, past family history, past medical history, past social history, past surgical history and problem list.  Review of Systems Pertinent items are noted in HPI.   Objective:    BP 111/80   Pulse 64   LMP  (Approximate)   Breastfeeding? Yes   General:  alert, cooperative and appears stated age   Breasts:  inspection negative, no nipple discharge or bleeding, no masses or nodularity palpable  Lungs: clear to auscultation bilaterally  Heart:  regular rate and rhythm, S1, S2 normal, no murmur, click, rub or gallop  Abdomen: soft, non-tender; bowel sounds normal; no masses,  no organomegaly  Extrem:   No edema; No rashes or erythema        Assessment:   6 week postpartum exam. Pap smear done 11/11/2014, negative with negative HPV, next due 2021.   Plan:    1. Contraception: Nexplanon 2. Pap in 2021 next due 3. Follow up in: 1 year or as needed.

## 2016-10-01 NOTE — Patient Instructions (Signed)
Etonogestrel implant What is this medicine? ETONOGESTREL (et oh noe JES trel) is a contraceptive (birth control) device. It is used to prevent pregnancy. It can be used for up to 3 years. This medicine may be used for other purposes; ask your health care provider or pharmacist if you have questions. COMMON BRAND NAME(S): Implanon, Nexplanon What should I tell my health care provider before I take this medicine? They need to know if you have any of these conditions: -abnormal vaginal bleeding -blood vessel disease or blood clots -cancer of the breast, cervix, or liver -depression -diabetes -gallbladder disease -headaches -heart disease or recent heart attack -high blood pressure -high cholesterol -kidney disease -liver disease -renal disease -seizures -tobacco smoker -an unusual or allergic reaction to etonogestrel, other hormones, anesthetics or antiseptics, medicines, foods, dyes, or preservatives -pregnant or trying to get pregnant -breast-feeding How should I use this medicine? This device is inserted just under the skin on the inner side of your upper arm by a health care professional. Talk to your pediatrician regarding the use of this medicine in children. Special care may be needed. Overdosage: If you think you have taken too much of this medicine contact a poison control center or emergency room at once. NOTE: This medicine is only for you. Do not share this medicine with others. What if I miss a dose? This does not apply. What may interact with this medicine? Do not take this medicine with any of the following medications: -amprenavir -bosentan -fosamprenavir This medicine may also interact with the following medications: -barbiturate medicines for inducing sleep or treating seizures -certain medicines for fungal infections like ketoconazole and itraconazole -grapefruit juice -griseofulvin -medicines to treat seizures like carbamazepine, felbamate, oxcarbazepine,  phenytoin, topiramate -modafinil -phenylbutazone -rifampin -rufinamide -some medicines to treat HIV infection like atazanavir, indinavir, lopinavir, nelfinavir, tipranavir, ritonavir -St. John's wort This list may not describe all possible interactions. Give your health care provider a list of all the medicines, herbs, non-prescription drugs, or dietary supplements you use. Also tell them if you smoke, drink alcohol, or use illegal drugs. Some items may interact with your medicine. What should I watch for while using this medicine? This product does not protect you against HIV infection (AIDS) or other sexually transmitted diseases. You should be able to feel the implant by pressing your fingertips over the skin where it was inserted. Contact your doctor if you cannot feel the implant, and use a non-hormonal birth control method (such as condoms) until your doctor confirms that the implant is in place. If you feel that the implant may have broken or become bent while in your arm, contact your healthcare provider. What side effects may I notice from receiving this medicine? Side effects that you should report to your doctor or health care professional as soon as possible: -allergic reactions like skin rash, itching or hives, swelling of the face, lips, or tongue -breast lumps -changes in emotions or moods -depressed mood -heavy or prolonged menstrual bleeding -pain, irritation, swelling, or bruising at the insertion site -scar at site of insertion -signs of infection at the insertion site such as fever, and skin redness, pain or discharge -signs of pregnancy -signs and symptoms of a blood clot such as breathing problems; changes in vision; chest pain; severe, sudden headache; pain, swelling, warmth in the leg; trouble speaking; sudden numbness or weakness of the face, arm or leg -signs and symptoms of liver injury like dark yellow or brown urine; general ill feeling or flu-like symptoms;  light-colored   stools; loss of appetite; nausea; right upper belly pain; unusually weak or tired; yellowing of the eyes or skin -unusual vaginal bleeding, discharge -signs and symptoms of a stroke like changes in vision; confusion; trouble speaking or understanding; severe headaches; sudden numbness or weakness of the face, arm or leg; trouble walking; dizziness; loss of balance or coordination Side effects that usually do not require medical attention (report to your doctor or health care professional if they continue or are bothersome): -acne -back pain -breast pain -changes in weight -dizziness -general ill feeling or flu-like symptoms -headache -irregular menstrual bleeding -nausea -sore throat -vaginal irritation or inflammation This list may not describe all possible side effects. Call your doctor for medical advice about side effects. You may report side effects to FDA at 1-800-FDA-1088. Where should I keep my medicine? This drug is given in a hospital or clinic and will not be stored at home. NOTE: This sheet is a summary. It may not cover all possible information. If you have questions about this medicine, talk to your doctor, pharmacist, or health care provider.  2018 Elsevier/Gold Standard (2016-01-12 11:19:22)  

## 2016-10-02 NOTE — Progress Notes (Signed)
     GYNECOLOGY CLINIC PROCEDURE NOTE  [name] WGNWin [name] is a 36 y.o. [phone] here for Nexplanon insertion.  Last pap smear was on 11/11/14 and was normal with negative HPV.  No other gynecologic concerns.  Nexplanon Insertion Procedure Patient identified, informed consent performed, consent signed.   Patient does understand that irregular bleeding is a very common side effect of this medication. She was advised to have backup contraception for one week after placement. Pregnancy test in clinic today was negative.  Appropriate time out taken.  Patient's left arm was prepped and draped in the usual sterile fashion.. The ruler used to measure and mark insertion area.  Patient was prepped with alcohol swab and then injected with 3 ml of 1% lidocaine.  She was prepped with betadine, Nexplanon removed from packaging,  Device confirmed in needle, then inserted full length of needle and withdrawn per handbook instructions. Nexplanon was able to palpated in the patient's arm; patient palpated the insert herself. There was minimal blood loss.  Patient insertion site covered with guaze and a pressure bandage to reduce any bruising.  The patient tolerated the procedure well and was given post procedure instructions.   Lot #: [id number]  Sierra ClarksELIZABETH W Levi Crass, DO OB FELLOW [facility], [facility]

## 2017-01-07 ENCOUNTER — Encounter (HOSPITAL_COMMUNITY): Payer: Self-pay | Admitting: Emergency Medicine

## 2017-01-07 ENCOUNTER — Emergency Department (HOSPITAL_COMMUNITY)
Admission: EM | Admit: 2017-01-07 | Discharge: 2017-01-07 | Disposition: A | Discharge status: Home / Self Care | Payer: Medicaid Other | Attending: Emergency Medicine | Admitting: Emergency Medicine

## 2017-01-07 DIAGNOSIS — Y999 Unspecified external cause status: Secondary | ICD-10-CM | POA: Insufficient documentation

## 2017-01-07 DIAGNOSIS — T23201A Burn of second degree of right hand, unspecified site, initial encounter: Secondary | ICD-10-CM | POA: Insufficient documentation

## 2017-01-07 DIAGNOSIS — Y9389 Activity, other specified: Secondary | ICD-10-CM | POA: Insufficient documentation

## 2017-01-07 DIAGNOSIS — T23209A Burn of second degree of unspecified hand, unspecified site, initial encounter: Secondary | ICD-10-CM

## 2017-01-07 DIAGNOSIS — Y929 Unspecified place or not applicable: Secondary | ICD-10-CM | POA: Insufficient documentation

## 2017-01-07 DIAGNOSIS — Z23 Encounter for immunization: Secondary | ICD-10-CM | POA: Insufficient documentation

## 2017-01-07 DIAGNOSIS — X102XXA Contact with fats and cooking oils, initial encounter: Secondary | ICD-10-CM | POA: Insufficient documentation

## 2017-01-07 MED ORDER — OXYCODONE-ACETAMINOPHEN 5-325 MG PO TABS
1.0000 | ORAL_TABLET | Freq: Once | ORAL | Status: AC
Start: 2017-01-07 — End: 2017-01-07
  Administered 2017-01-07: 1 via ORAL
  Filled 2017-01-07: qty 1

## 2017-01-07 MED ORDER — IBUPROFEN 400 MG PO TABS
600.0000 mg | ORAL_TABLET | Freq: Once | ORAL | Status: AC
  Administered 2017-01-07: 600 mg via ORAL
  Filled 2017-01-07: qty 1

## 2017-01-07 MED ORDER — HYDROCODONE-ACETAMINOPHEN 5-325 MG PO TABS: 1.0000 | ORAL_TABLET | ORAL | 0 refills | Status: DC | PRN

## 2017-01-07 MED ORDER — TETANUS-DIPHTH-ACELL PERTUSSIS 5-2.5-18.5 LF-MCG/0.5 IM SUSP
0.5000 mL | Freq: Once | INTRAMUSCULAR | Status: AC
  Administered 2017-01-07: 0.5 mL via INTRAMUSCULAR
  Filled 2017-01-07: qty 0.5

## 2017-01-07 NOTE — ED Notes (Signed)
Patient calling spouse at this time.
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
## 2017-01-07 NOTE — ED Notes (Signed)
Dr. Campos at bedside at this time.  
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
## 2017-01-07 NOTE — ED Notes (Signed)
Translator services used at time of discharge, pt denies further questions.

## 2017-08-22 IMAGING — US US MFM OB TRANSVAGINAL
1 series · 13 of 28 positions shown · non-contrast
Comparison: none

[Series 1: us mfm ob transvaginal · 136 acquisitions, 13 frames shown]
[im 6/136]
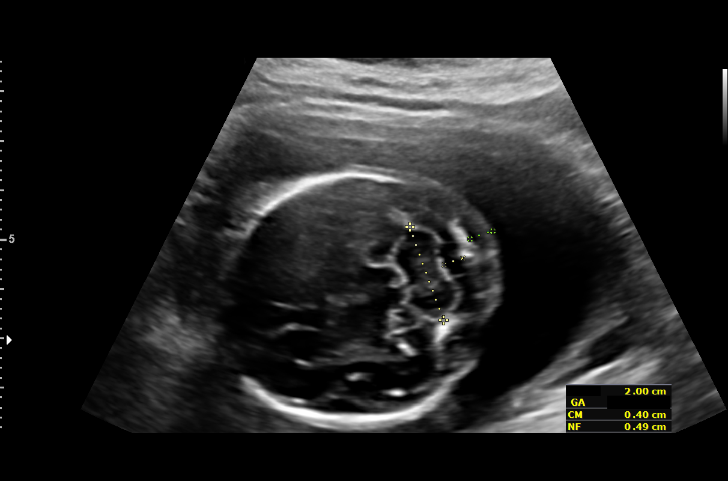
[im 16/136]
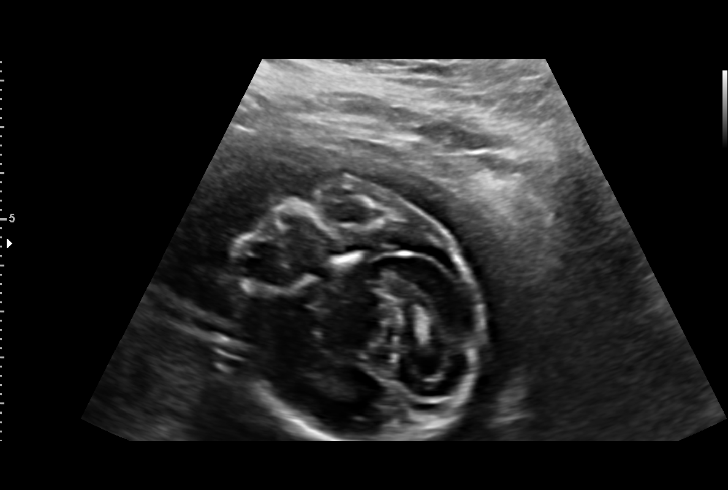
[im 26/136]
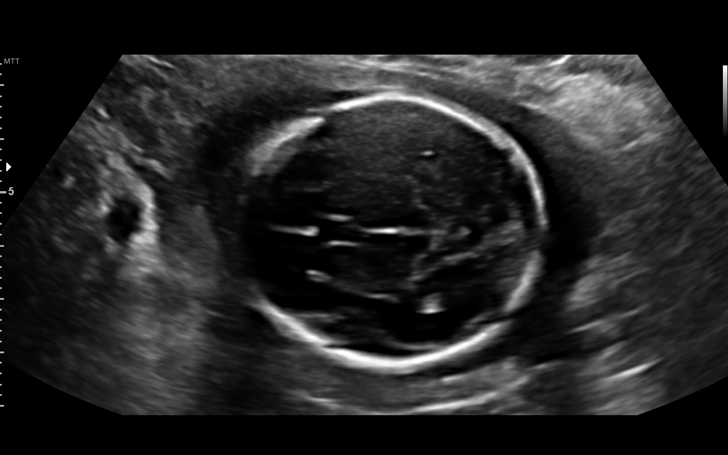
[im 36/136]
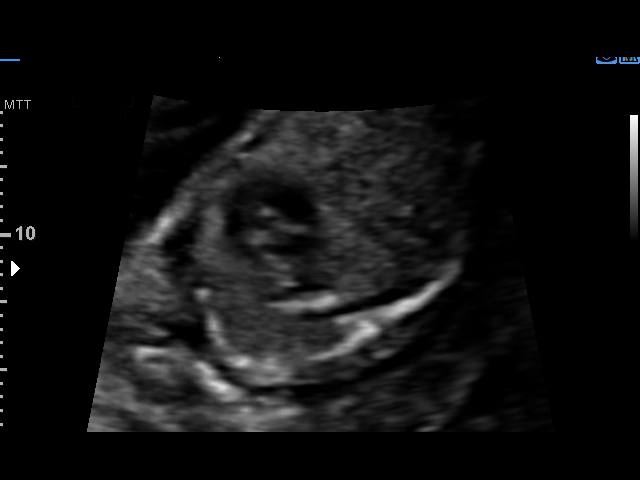
[im 46/136]
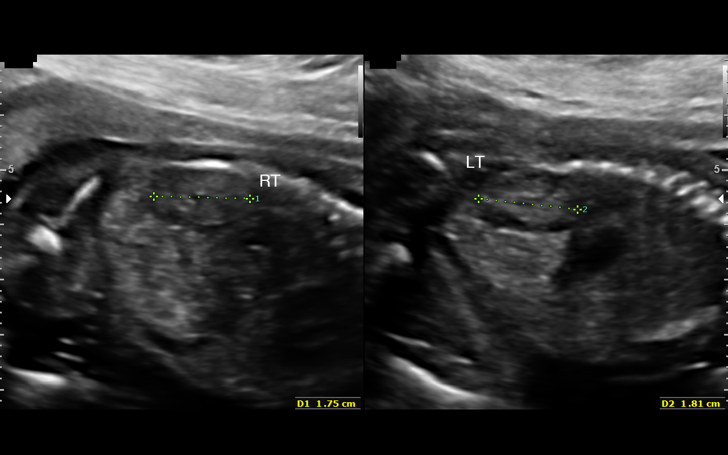
[im 56/136]
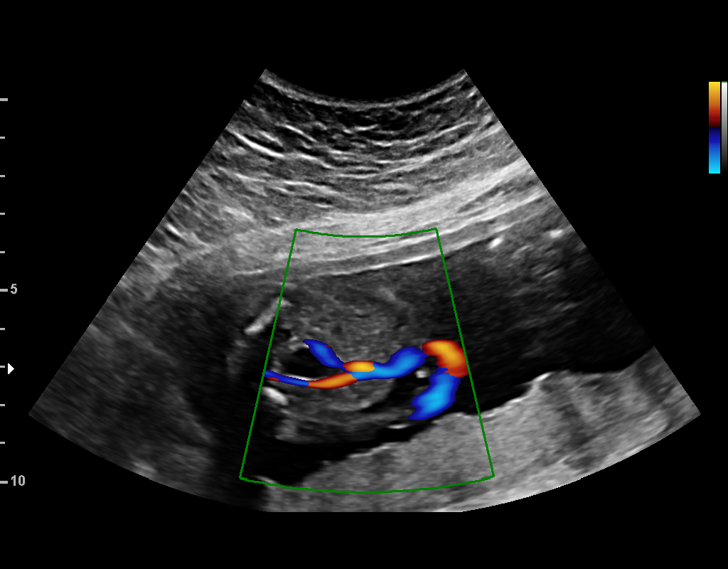
[im 71/136]
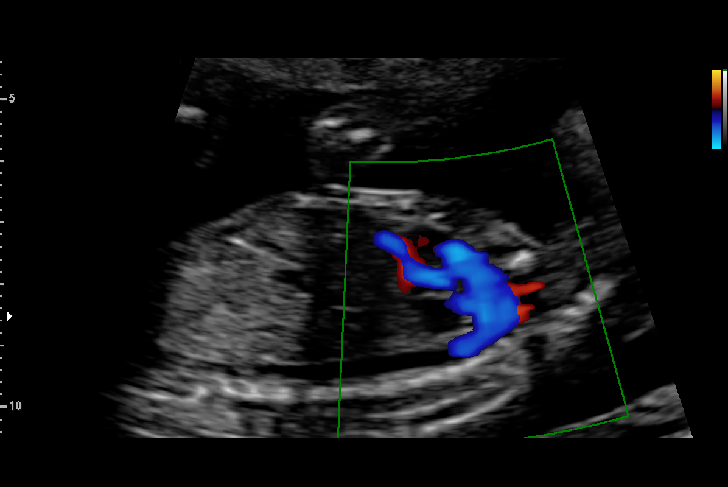
[im 81/136]
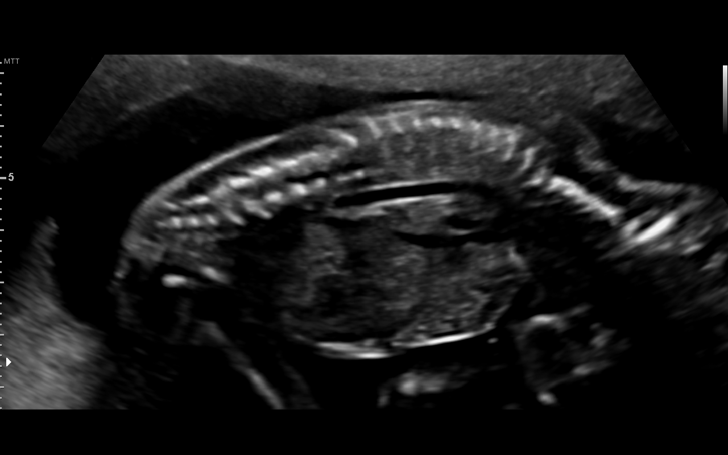
[im 91/136]
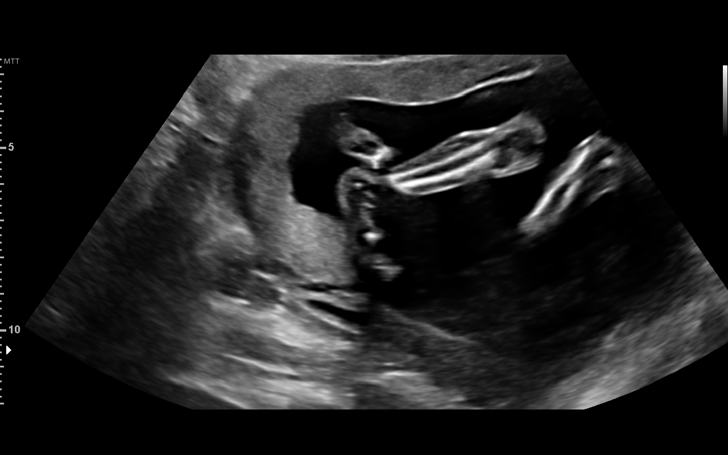
[im 101/136]
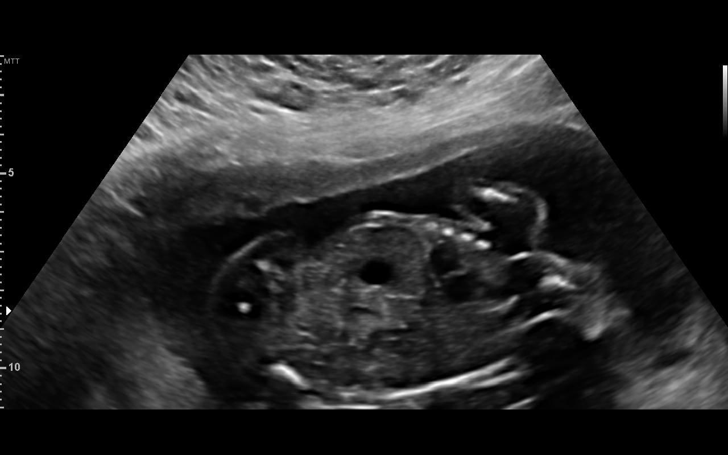
[im 111/136]
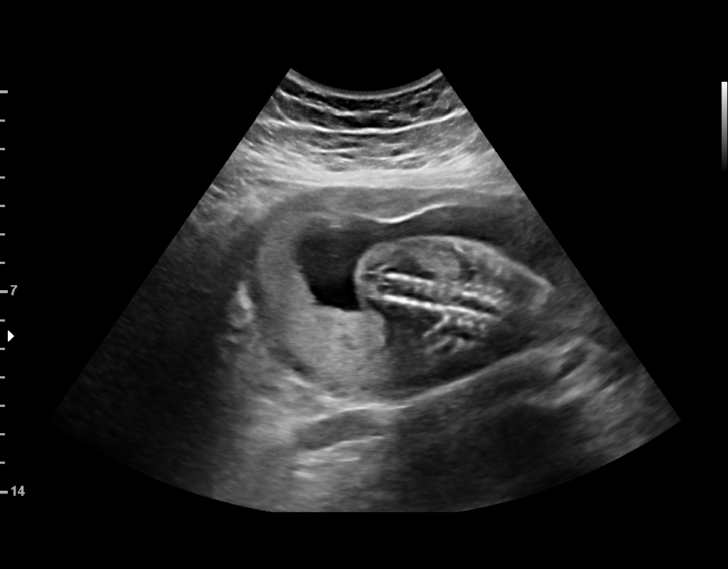
[im 121/136]
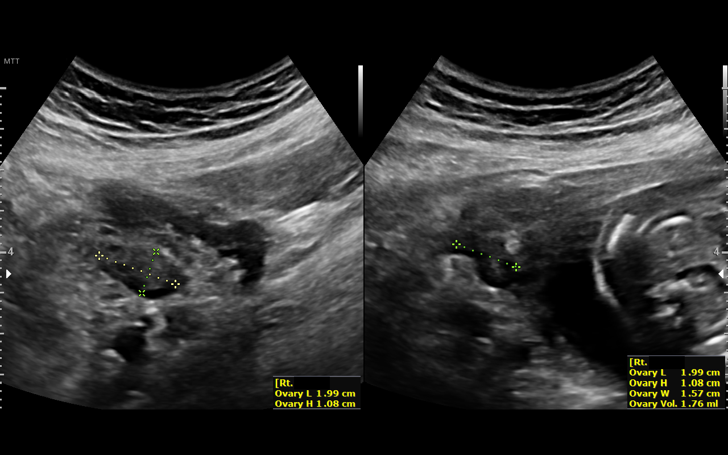
[im 131/136]
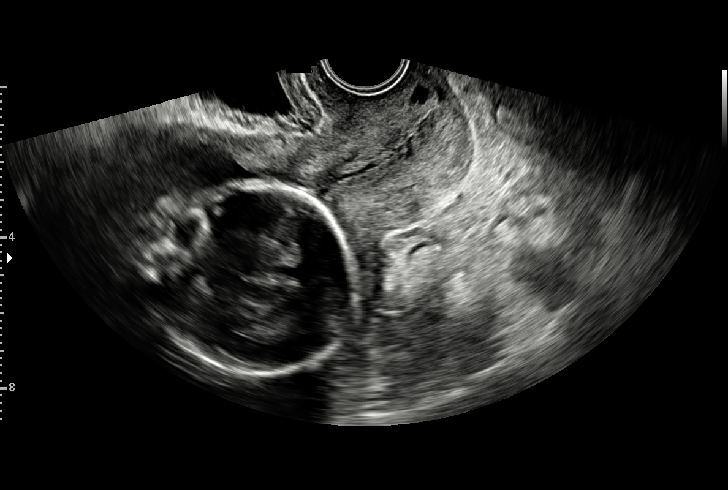

[13 of 28 positions shown; findings below may reference images not displayed]

Performed By:     Bayu Adi Samhendra BS,       Secondary Phy.:   [[id number]]
OB/Gyn Clinic

1  ADA BOJANA EFENDIJA           [id number]      [id number]     [id number]
2  SID COULTER           [id number]      [id number]     [id number]
Indications

19 weeks gestation of pregnancy
Encounter for antenatal screening for
malformations
Poor obstetric history: Previous preterm
delivery, antepartum
OB History

Blood Type:            Height:  5'3"   Weight (lb):  175       BMI:  31
Gravidity:    3         Term:   1        Prem:   1        SAB:   0
TOP:          0       Ectopic:  0        Living: 2
Fetal Evaluation

Num Of Fetuses:     1
Fetal Heart         159
Rate(bpm):
Cardiac Activity:   Observed
Presentation:       Cephalic
Placenta:           Fundal, above cervical os
P. Cord Insertion:  Visualized, central
Amniotic Fluid
AFI FV:      Subjectively within normal limits

Largest Pocket(cm)
5.7
Biometry

BPD:      49.7  mm     G. Age:  21w 0d         93  %    CI:         84.2   %    70 - 86
FL/HC:      19.2   %    16.8 -
HC:      170.7  mm     G. Age:  19w 5d         41  %    HC/AC:      1.13        1.09 -
AC:      151.7  mm     G. Age:  20w 3d         67  %    FL/BPD:     66.0   %
FL:       32.8  mm     G. Age:  20w 2d         62  %    FL/AC:      21.6   %    20 - 24
HUM:      30.3  mm     G. Age:  20w 0d         60  %
CER:        20  mm     G. Age:  19w 0d         36  %
NFT:       4.9  mm

CM:          4  mm

Est. FW:     344  gm    0 lb 12 oz      56  %
Gestational Age

LMP:           19w 5d        Date:  11/26/15                 EDD:   09/01/16
U/S Today:     20w 3d                                        EDD:   08/27/16
Best:          19w 5d     Det. By:  LMP  (11/26/15)          EDD:   09/01/16
Anatomy

Cranium:               Appears normal         Aortic Arch:            Appears normal
Cavum:                 Appears normal         Ductal Arch:            Appears normal
Ventricles:            Appears normal         Diaphragm:              Appears normal
Choroid Plexus:        Appears normal         Stomach:                Appears normal, left
sided
Cerebellum:            Appears normal         Abdomen:                Appears normal
Posterior Fossa:       Appears normal         Abdominal Wall:         Appears nml (cord
insert, abd wall)
Nuchal Fold:           Appears normal         Cord Vessels:           Appears normal (3
vessel cord)
Face:                  Appears normal         Kidneys:                Appear normal
(orbits and profile)
Lips:                  Appears normal         Bladder:                Appears normal
Thoracic:              Appears normal         Spine:                  Appears normal
Heart:                 Appears normal :EIF    Upper Extremities:      Appears normal
RVOT:                  Appears normal         Lower Extremities:      Appears normal
LVOT:                  Appears normal

Other:  Fetus appears to be a female. Heels and 5th digit visualized. Nasal
bone visualized.
Cervix Uterus Adnexa

Cervix
Length:           4.11  cm.
Normal appearance by transvaginal scan

Uterus
No abnormality visualized.

Left Ovary
Size(cm)     2.19   x   2.51   x  1.38      Vol(ml): 4
Within normal limits. No adnexal mass visualized.
Right Ovary
Size(cm)     1.99   x   1.57   x  1.08      Vol(ml):
Within normal limits. No adnexal mass visualized.

Cul De Sac:   No free fluid seen.

Adnexa:       No abnormality visualized.
Impression

Singleton intrauterine pregnancy at 19+5 weeks. History of
late preterm delivery
Review of the anatomy shows no sonographic markers for
aneuploidy or structural anomalies
However, evaluations should be considered suboptimal
secondary to fetal position
Amniotic fluid volume is normal with a MVP of 5.7cm
Estimated fetal weight is 344g which is growth in the 56th
percentile
Cervix if 41mm in length with no funnelling or chages with
transfundal pressure
Recommendations

Repeat evaluation of growth and cervix in 4 weeks.

## 2017-09-19 IMAGING — US US MFM OB TRANSVAGINAL
1 series · 13 of 28 positions shown · non-contrast
Comparison: none

[Series 1: us mfm ob transvaginal · 13 of 49 slices shown]
[im 2/49]
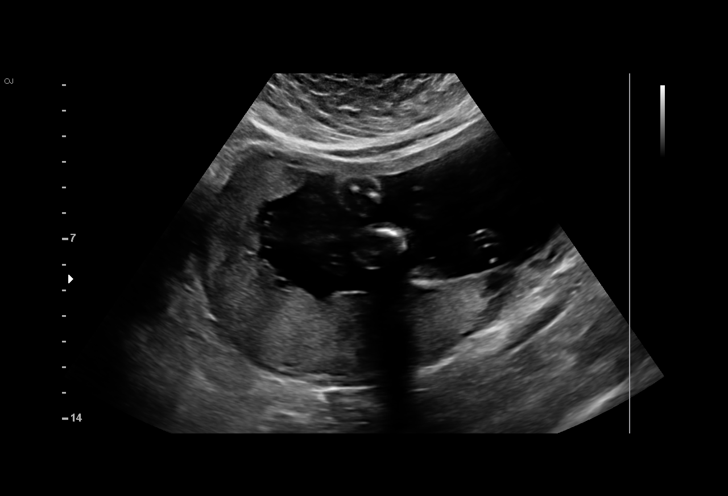
[im 6/49]
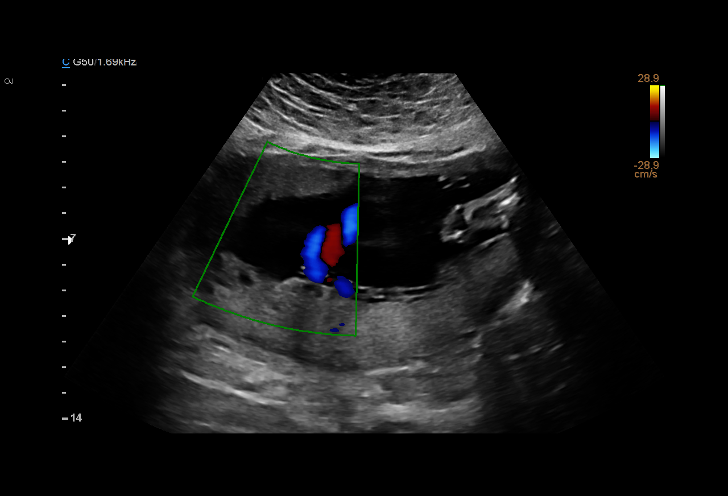
[im 9/49]
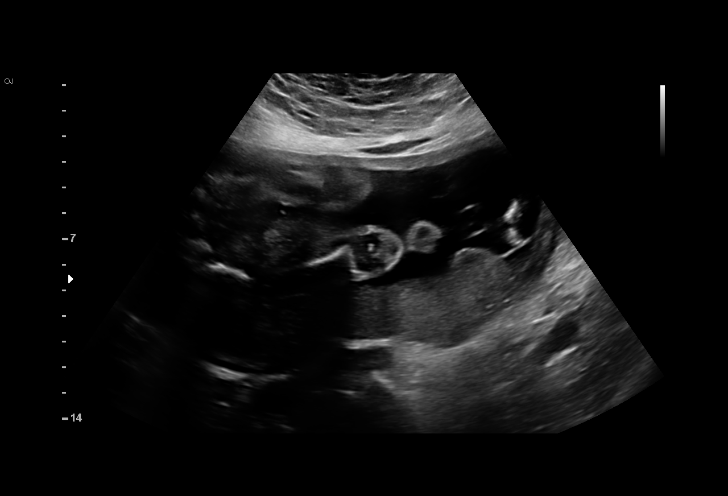
[im 13/49]
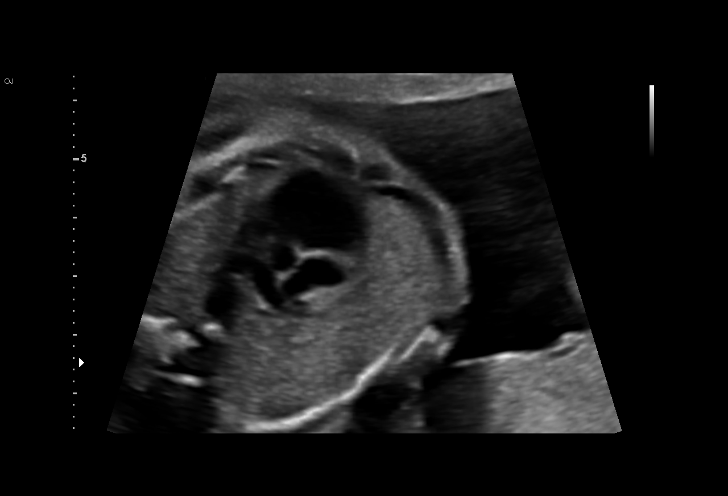
[im 17/49]
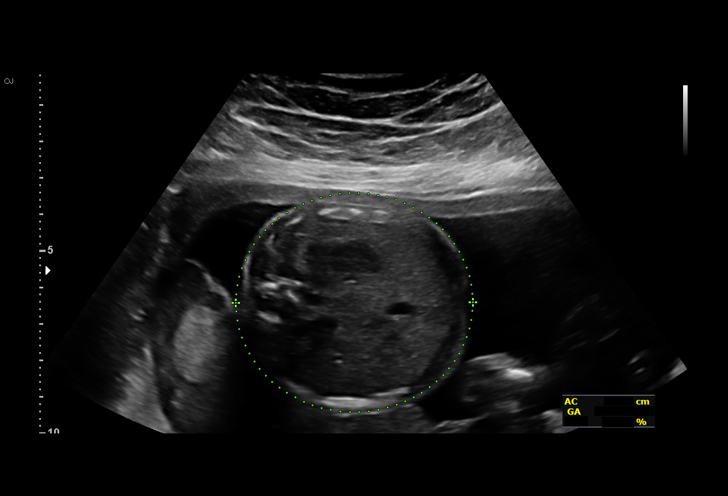
[im 20/49]
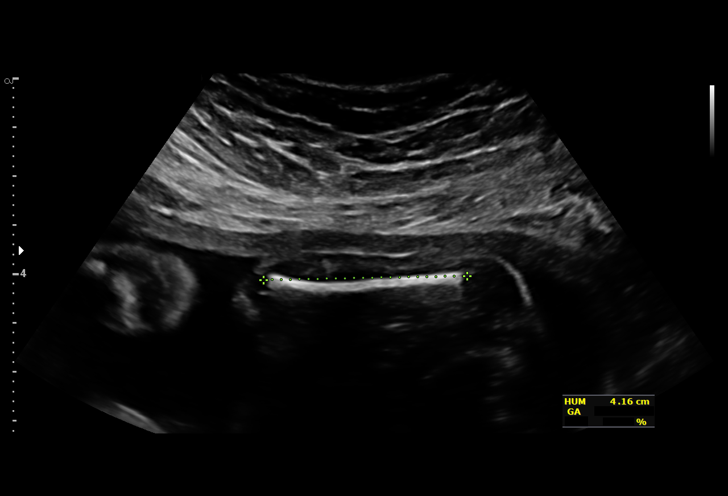
[im 25/49]
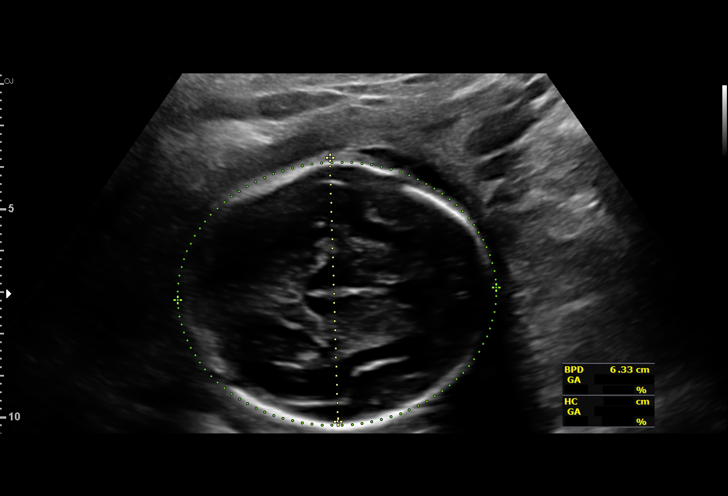
[im 29/49]
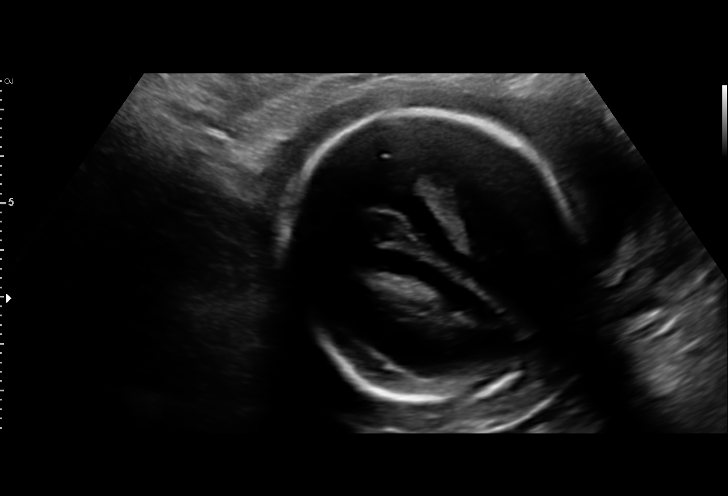
[im 33/49]
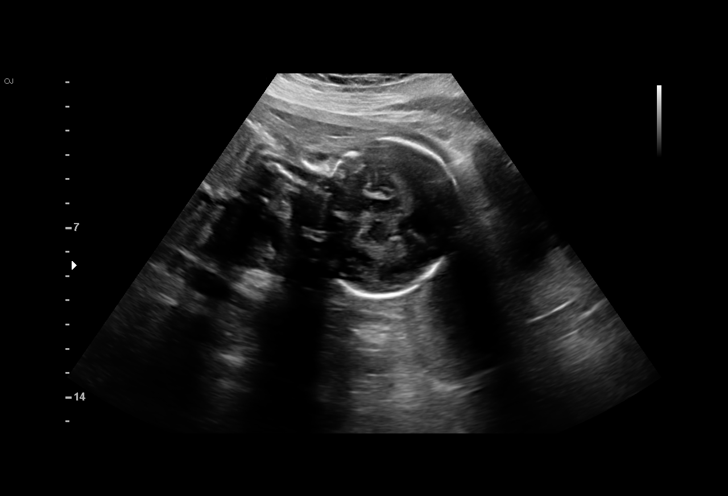
[im 36/49]
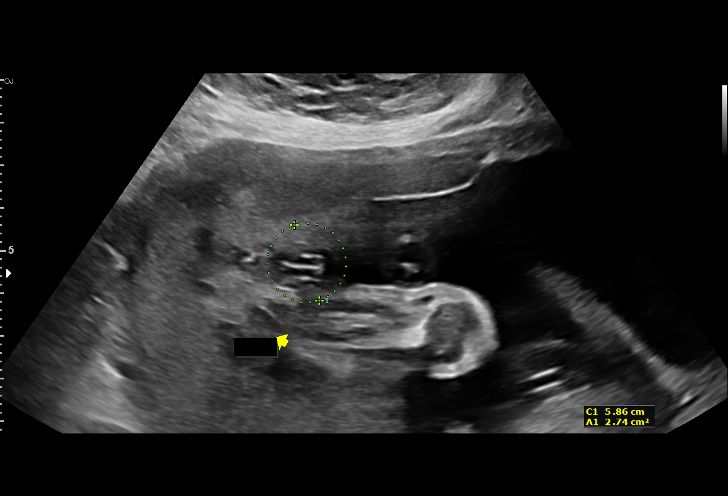
[im 40/49]
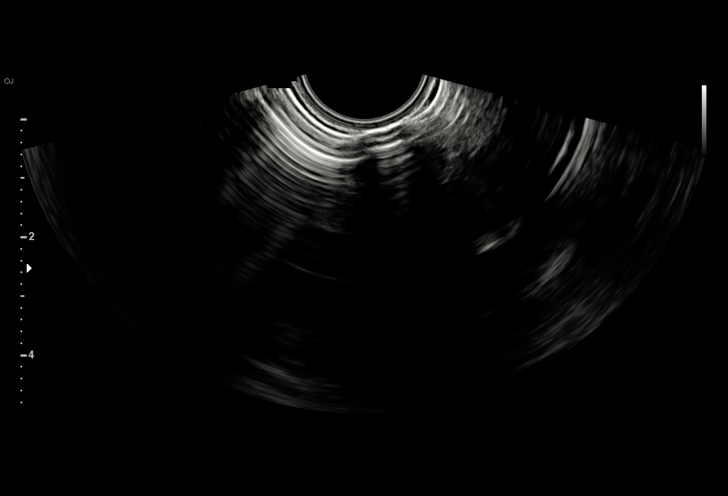
[im 43/49]
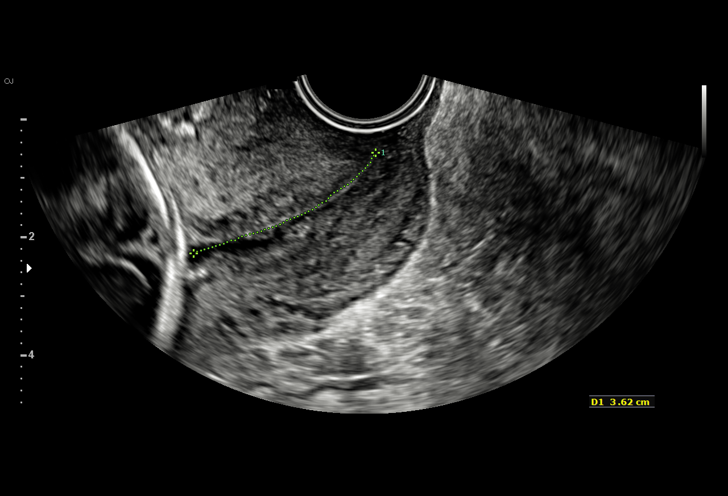
[im 47/49]
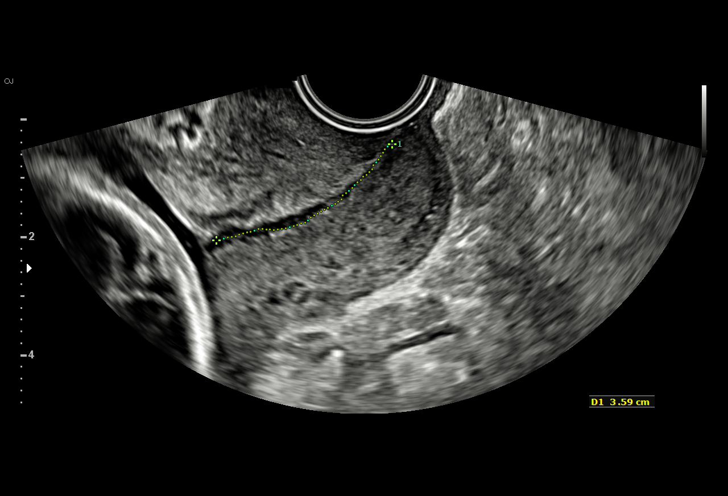

[13 of 28 positions shown; findings below may reference images not displayed]

Performed By:     Sujiren Eidikis         Secondary Phy.:   [[id number]]
OB/Gyn Clinic

1  SHIFUGULA CASSINDA              [id number]      [id number]     [id number]
2  YARI GRIFFEN              [id number]      [id number]     [id number]
Indications

23 weeks gestation of pregnancy
Poor obstetric history: Previous preterm
delivery, antepartum(36)
OB History

Blood Type:            Height:  5'3"   Weight (lb):  175       BMI:  31
Gravidity:    3         Term:   1        Prem:   1        SAB:   0
TOP:          0       Ectopic:  0        Living: 2
Fetal Evaluation

Num Of Fetuses:     1
Fetal Heart         154
Rate(bpm):
Cardiac Activity:   Observed
Presentation:       Cephalic
Placenta:           Fundal, above cervical os
P. Cord Insertion:  Previously Visualized
Amniotic Fluid
AFI FV:      Subjectively within normal limits

Largest Pocket(cm)
4.61
Biometry

BPD:      63.2  mm     G. Age:  25w 4d         95  %    CI:        84.39   %    70 - 86
FL/HC:      20.2   %    18.7 -
HC:      216.8  mm     G. Age:  23w 5d         34  %    HC/AC:      1.10        1.05 -
AC:      197.8  mm     G. Age:  24w 3d         64  %    FL/BPD:     69.5   %    71 - 87
FL:       43.9  mm     G. Age:  24w 3d         61  %    FL/AC:      22.2   %    20 - 24
HUM:      41.6  mm     G. Age:  25w 1d         74  %
CER:      26.9  mm     G. Age:  24w 3d         64  %

Est. FW:     695  gm      1 lb 9 oz     65  %
Gestational Age

LMP:           23w 5d        Date:  11/26/15                 EDD:   09/01/16
U/S Today:     24w 4d                                        EDD:   08/26/16
Best:          23w 5d     Det. By:  LMP  (11/26/15)          EDD:   09/01/16
Anatomy

Cranium:               Appears normal         Aortic Arch:            Previously seen
Cavum:                 Appears normal         Ductal Arch:            Previously seen
Ventricles:            Appears normal         Diaphragm:              Appears normal
Choroid Plexus:        Appears normal         Stomach:                Appears normal, left
sided
Cerebellum:            Appears normal         Abdomen:                Appears normal
Posterior Fossa:       Appears normal         Abdominal Wall:         Previously seen
Nuchal Fold:           Previously seen        Cord Vessels:           Appears normal (3
vessel cord)
Face:                  Orbits and profile     Kidneys:                Appear normal
previously seen
Lips:                  Previously seen        Bladder:                Appears normal
Thoracic:              Appears normal         Spine:                  Previously seen
Heart:                 Appears normal         Upper Extremities:      Previously seen
RVOT:                  Appears normal         Lower Extremities:      Previously seen
LVOT:                  Appears normal

Other:  Fetus appears to be a female. Heels and 5th digit prev  visualized.
Nasal bone visualized.
Cervix Uterus Adnexa

Cervix
Length:            3.7  cm.
Normal appearance by transabdominal scan.

Uterus
No abnormality visualized.

Left Ovary
Within normal limits.

Right Ovary
Within normal limits.
Impression

Singleton intrauterine pregnancy at 23+5 weeks
Anatomic survey has been previously completed
Amniotic fluid volume is normal
Estimated fetal weight is 695g which is growth in the 65th
percentile
Transvaginal cervical length is 37mm with no funnelling and
no changes with transfundal pressure
Recommendations

Given normal growth, development and cervical anatomy, no
further visits are indicated in MFM. Follow-up ultrasounds as
clinically indicated.

## 2018-05-08 ENCOUNTER — Ambulatory Visit: Payer: Self-pay | Admitting: Obstetrics and Gynecology

## 2018-05-15 ENCOUNTER — Ambulatory Visit (INDEPENDENT_AMBULATORY_CARE_PROVIDER_SITE_OTHER): Payer: Medicaid Other | Admitting: Obstetrics and Gynecology

## 2018-05-15 ENCOUNTER — Other Ambulatory Visit (HOSPITAL_COMMUNITY)
Admission: RE | Admit: 2018-05-15 | Discharge: 2018-05-15 | Disposition: A | Discharge status: Home / Self Care | Payer: Medicaid Other | Source: Ambulatory Visit | Attending: Obstetrics and Gynecology | Admitting: Obstetrics and Gynecology

## 2018-05-15 ENCOUNTER — Encounter: Payer: Self-pay | Admitting: Obstetrics and Gynecology

## 2018-05-15 DIAGNOSIS — Z01419 Encounter for gynecological examination (general) (routine) without abnormal findings: Secondary | ICD-10-CM | POA: Insufficient documentation

## 2018-05-15 DIAGNOSIS — Z0000 Encounter for general adult medical examination without abnormal findings: Secondary | ICD-10-CM | POA: Diagnosis not present

## 2018-05-15 DIAGNOSIS — Z975 Presence of (intrauterine) contraceptive device: Secondary | ICD-10-CM | POA: Insufficient documentation

## 2018-05-15 NOTE — Patient Instructions (Signed)

## 2018-05-15 NOTE — Progress Notes (Signed)
[name] is a 37 y.o. [phone] female here for a routine annual gynecologic exam.    Denies abnormal vaginal bleeding, discharge, pelvic pain, problems with intercourse or other gynecologic concerns.    Gynecologic History No LMP recorded. Patient has had an implant. Contraception: Nexplanon Last Pap: 5/16. Results were: normal Last mammogram: NA. Results were: NA  Obstetric History OB History  Gravida Para Term Preterm AB Living  3 3 2 1  0 3  SAB TAB Ectopic Multiple Live Births  0 0 0 0 3    # Outcome Date GA Lbr Len/2nd Weight Sex Delivery Anes PTL Lv  3 Term 08/20/16 [redacted]w[redacted]d 01:45 / 00:10 6 lb 14.8 oz (3.14 kg) F Vag-Spont None  LIV     Birth Comments: WNL  2 Term 05/20/15 [redacted]w[redacted]d 09:53 / 01:50 7 lb 3.5 oz (3.275 kg) F Vag-Spont EPI, Local  LIV     Birth Comments: left knee hyperextended  1 Preterm 09/06/11 [redacted]w[redacted]d 21:35 / 02:15 6 lb 9 oz (2.977 kg) M Vag-Spont EPI  LIV     Complications: Preterm premature rupture of membranes (PPROM) delivered, current hospitalization    Past Medical History:  Diagnosis Date  . Headache(784.0)   . 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     Past Surgical History:  Procedure Laterality Date  . NO PAST SURGERIES      Current Outpatient Medications on File Prior to Visit  Medication Sig Dispense Refill  . Prenatal Vit-Fe Fumarate-FA (PRENATAL VITAMIN PLUS LOW IRON) 27-1 MG TABS Take 1 tablet by mouth daily.     No current facility-administered medications on file prior to visit.     No Known Allergies  Social History   Socioeconomic History  . Marital status: Married    Spouse name: Not on file  . Number of children: Not on file  . Years of education: Not on file  . Highest education level: Not on file  Occupational History  . Not on file  Social Needs  . Financial resource strain: Not on file  . Food insecurity:    Worry: Not on file    Inability: Not on file  . Transportation needs:   Medical: Not on file    Non-medical: Not on file  Tobacco Use  . Smoking status: Never Smoker  . Smokeless tobacco: Never Used  Substance and Sexual Activity  . Alcohol use: No  . Drug use: No  . Sexual activity: Yes    Birth control/protection: None  Lifestyle  . Physical activity:    Days per week: Not on file    Minutes per session: Not on file  . Stress: Not on file  Relationships  . Social connections:    Talks on phone: Not on file    Gets together: Not on file    Attends religious service: Not on file    Active member of club or organization: Not on file    Attends meetings of clubs or organizations: Not on file    Relationship status: Not on file  . Intimate partner violence:    Fear of current or ex partner: Not on file    Emotionally abused: Not on file    Physically abused: Not on file    Forced sexual activity: Not on file  Other Topics Concern  . Not on file  Social History Narrative  . Not on file    Family History  Problem Relation  Age of Onset  . Anesthesia problems Neg Hx     The following portions of the patient's history were reviewed and updated as appropriate: allergies, current medications, past family history, past medical history, past social history, past surgical history and problem list.  Review of Systems Pertinent items noted in HPI and remainder of comprehensive ROS otherwise negative.   Objective:  BP 118/81   Pulse 68   Ht 5\' 2"  (1.575 m)   Wt 181 lb 1.6 oz (82.1 kg)   BMI 33.12 kg/m  CONSTITUTIONAL: Well-developed, well-nourished female in no acute distress.  HENT:  Normocephalic, atraumatic, External right and left ear normal. Oropharynx is clear and moist EYES: Conjunctivae and EOM are normal. Pupils are equal, round, and reactive to light. No scleral icterus.  NECK: Normal range of motion, supple, no masses.  Normal thyroid.  SKIN: Skin is warm and dry. No rash noted. Not diaphoretic. No erythema. No pallor. NEUROLGIC: Alert  and oriented to person, place, and time. Normal reflexes, muscle tone coordination. No cranial nerve deficit noted. PSYCHIATRIC: Normal mood and affect. Normal behavior. Normal judgment and thought content. CARDIOVASCULAR: Normal heart rate noted, regular rhythm RESPIRATORY: Clear to auscultation bilaterally. Effort and breath sounds normal, no problems with respiration noted. BREASTS: Symmetric in size. No masses, skin changes, nipple drainage, or lymphadenopathy. ABDOMEN: Soft, normal bowel sounds, no distention noted.  No tenderness, rebound or guarding.  PELVIC: Normal appearing external genitalia; normal appearing vaginal mucosa and cervix.  No abnormal discharge noted.  Pap smear obtained.  Normal uterine size, no other palpable masses, no uterine or adnexal tenderness. MUSCULOSKELETAL: Normal range of motion. No tenderness.  No cyanosis, clubbing, or edema.  2+ distal pulses.   Assessment:  Annual gynecologic examination with pap smear Implanon contraception Plan:  Will follow up results of pap smear and manage accordingly. Routine preventative health maintenance measures emphasized. Please refer to After Visit Summary for other counseling recommendations.    Hermina Staggers, MD, FACOG Attending Obstetrician & Gynecologist Center for [facility], [facility]

## 2018-05-21 LAB — CYTOLOGY - PAP
DIAGNOSIS: NEGATIVE
HPV: NOT DETECTED
